# Patient Record
Sex: Female | Born: 1995 | Race: Black or African American | Hispanic: No | Marital: Single | Smoking: Former smoker
Health system: Southern US, Community
[De-identification: ages and names within clinical notes are randomized; demographics above are authoritative.]

## PROBLEM LIST (undated history)

## (undated) DIAGNOSIS — B009 Herpesviral infection, unspecified: Secondary | ICD-10-CM

## (undated) DIAGNOSIS — Z789 Other specified health status: Secondary | ICD-10-CM

## (undated) DIAGNOSIS — F419 Anxiety disorder, unspecified: Secondary | ICD-10-CM

## (undated) HISTORY — PX: OTHER SURGICAL HISTORY: SHX169

---
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
## 2014-01-02 LAB — OB RESULTS CONSOLE GC/CHLAMYDIA
Chlamydia: POSITIVE
Gonorrhea: NEGATIVE

## 2014-01-02 LAB — OB RESULTS CONSOLE RPR: RPR: NONREACTIVE

## 2014-02-09 NOTE — L&D Delivery Note (Signed)
Patient is 19 y.o. 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  APGAR: 8, 9; weight pending Placenta status: , .  Cord: 3 vessels with the following complications:   Anesthesia:  epidural Episiotomy:  none Lacerations:  None, bilateral superior labial abrasions Suture Repair: n/a Est. Blood Loss (mL):  150mL  Mom to postpartum.  Baby to Couplet care / Skin to Skin.  Natasha Padilla 04/16/2014, 10:26 AM

## 2014-02-23 LAB — OB RESULTS CONSOLE PLATELET COUNT: PLATELETS: 203 10*3/uL

## 2014-02-23 LAB — OB RESULTS CONSOLE HGB/HCT, BLOOD
HCT: 33 %
HEMOGLOBIN: 11 g/dL

## 2014-02-23 LAB — HEMOGLOBIN A1C: Hgb A1C (fingerstick): 5.2

## 2014-03-12 ENCOUNTER — Other Ambulatory Visit (HOSPITAL_COMMUNITY): Payer: Self-pay | Admitting: Specialist

## 2014-03-12 DIAGNOSIS — O4102X1 Oligohydramnios, second trimester, fetus 1: Secondary | ICD-10-CM

## 2014-03-13 ENCOUNTER — Other Ambulatory Visit (HOSPITAL_COMMUNITY): Payer: Self-pay | Admitting: Maternal and Fetal Medicine

## 2014-03-13 ENCOUNTER — Encounter (HOSPITAL_COMMUNITY): Payer: Self-pay

## 2014-03-13 ENCOUNTER — Other Ambulatory Visit (HOSPITAL_COMMUNITY): Payer: Self-pay | Admitting: Specialist

## 2014-03-13 ENCOUNTER — Ambulatory Visit (HOSPITAL_COMMUNITY)
Admission: RE | Admit: 2014-03-13 | Discharge: 2014-03-13 | Disposition: A | Discharge status: Home / Self Care | Payer: Medicaid Other | Source: Ambulatory Visit | Attending: Specialist | Admitting: Specialist

## 2014-03-13 DIAGNOSIS — O358XX0 Maternal care for other (suspected) fetal abnormality and damage, not applicable or unspecified: Secondary | ICD-10-CM | POA: Insufficient documentation

## 2014-03-13 DIAGNOSIS — O365930 Maternal care for other known or suspected poor fetal growth, third trimester, not applicable or unspecified: Secondary | ICD-10-CM | POA: Diagnosis not present

## 2014-03-13 DIAGNOSIS — Z3A35 35 weeks gestation of pregnancy: Secondary | ICD-10-CM | POA: Insufficient documentation

## 2014-03-13 DIAGNOSIS — O4102X1 Oligohydramnios, second trimester, fetus 1: Secondary | ICD-10-CM

## 2014-03-13 DIAGNOSIS — O4103X0 Oligohydramnios, third trimester, not applicable or unspecified: Secondary | ICD-10-CM | POA: Insufficient documentation

## 2014-03-13 DIAGNOSIS — Z3689 Encounter for other specified antenatal screening: Secondary | ICD-10-CM | POA: Insufficient documentation

## 2014-03-13 DIAGNOSIS — Z36 Encounter for antenatal screening of mother: Secondary | ICD-10-CM | POA: Insufficient documentation

## 2014-03-13 DIAGNOSIS — O35DXX0 Maternal care for other (suspected) fetal abnormality and damage, fetal gastrointestinal anomalies, not applicable or unspecified: Secondary | ICD-10-CM | POA: Insufficient documentation

## 2014-03-19 LAB — OB RESULTS CONSOLE GBS: GBS: NEGATIVE

## 2014-03-20 ENCOUNTER — Ambulatory Visit (HOSPITAL_COMMUNITY)
Admission: RE | Admit: 2014-03-20 | Discharge: 2014-03-20 | Disposition: A | Discharge status: Home / Self Care | Payer: Medicaid Other | Source: Ambulatory Visit | Attending: Specialist | Admitting: Specialist

## 2014-03-20 ENCOUNTER — Other Ambulatory Visit (HOSPITAL_COMMUNITY): Payer: Self-pay | Admitting: Maternal and Fetal Medicine

## 2014-03-20 DIAGNOSIS — O4102X1 Oligohydramnios, second trimester, fetus 1: Secondary | ICD-10-CM

## 2014-03-20 DIAGNOSIS — Z3A36 36 weeks gestation of pregnancy: Secondary | ICD-10-CM | POA: Insufficient documentation

## 2014-03-20 DIAGNOSIS — IMO0002 Reserved for concepts with insufficient information to code with codable children: Secondary | ICD-10-CM | POA: Insufficient documentation

## 2014-03-20 DIAGNOSIS — O359XX0 Maternal care for (suspected) fetal abnormality and damage, unspecified, not applicable or unspecified: Secondary | ICD-10-CM | POA: Diagnosis present

## 2014-03-20 DIAGNOSIS — O4100X0 Oligohydramnios, unspecified trimester, not applicable or unspecified: Secondary | ICD-10-CM | POA: Insufficient documentation

## 2014-03-21 ENCOUNTER — Encounter: Payer: Self-pay | Admitting: *Deleted

## 2014-03-28 ENCOUNTER — Encounter (HOSPITAL_COMMUNITY): Payer: Self-pay

## 2014-03-28 ENCOUNTER — Ambulatory Visit (HOSPITAL_COMMUNITY)
Admission: RE | Admit: 2014-03-28 | Discharge: 2014-03-28 | Disposition: A | Discharge status: Home / Self Care | Payer: Medicaid Other | Source: Ambulatory Visit | Attending: Specialist | Admitting: Specialist

## 2014-03-28 ENCOUNTER — Other Ambulatory Visit (HOSPITAL_COMMUNITY): Payer: Self-pay | Admitting: Maternal and Fetal Medicine

## 2014-03-28 DIAGNOSIS — O4102X1 Oligohydramnios, second trimester, fetus 1: Secondary | ICD-10-CM

## 2014-03-28 DIAGNOSIS — O358XX0 Maternal care for other (suspected) fetal abnormality and damage, not applicable or unspecified: Secondary | ICD-10-CM | POA: Insufficient documentation

## 2014-03-28 DIAGNOSIS — Z3A37 37 weeks gestation of pregnancy: Secondary | ICD-10-CM | POA: Insufficient documentation

## 2014-03-28 DIAGNOSIS — O35EXX0 Maternal care for other (suspected) fetal abnormality and damage, fetal genitourinary anomalies, not applicable or unspecified: Secondary | ICD-10-CM | POA: Insufficient documentation

## 2014-03-28 HISTORY — DX: Herpesviral infection, unspecified: B00.9

## 2014-03-29 ENCOUNTER — Encounter: Payer: Self-pay | Admitting: Physician Assistant

## 2014-03-29 ENCOUNTER — Ambulatory Visit (INDEPENDENT_AMBULATORY_CARE_PROVIDER_SITE_OTHER): Payer: Medicaid Other | Admitting: Physician Assistant

## 2014-03-29 ENCOUNTER — Other Ambulatory Visit: Payer: Self-pay | Admitting: Physician Assistant

## 2014-03-29 VITALS — BP 123/73 | HR 95 | Temp 98.7°F | Wt 150.2 lb

## 2014-03-29 DIAGNOSIS — Z3403 Encounter for supervision of normal first pregnancy, third trimester: Secondary | ICD-10-CM

## 2014-03-29 DIAGNOSIS — Z3493 Encounter for supervision of normal pregnancy, unspecified, third trimester: Secondary | ICD-10-CM

## 2014-03-29 LAB — POCT URINALYSIS DIP (DEVICE)
BILIRUBIN URINE: NEGATIVE
GLUCOSE, UA: NEGATIVE mg/dL
HGB URINE DIPSTICK: NEGATIVE
Ketones, ur: NEGATIVE mg/dL
LEUKOCYTES UA: NEGATIVE
NITRITE: NEGATIVE
Protein, ur: NEGATIVE mg/dL
Specific Gravity, Urine: 1.02 (ref 1.005–1.030)
UROBILINOGEN UA: 1 mg/dL (ref 0.0–1.0)
pH: 7 (ref 5.0–8.0)

## 2014-03-29 LAB — OB RESULTS CONSOLE GC/CHLAMYDIA: CHLAMYDIA, DNA PROBE: NEGATIVE

## 2014-03-29 MED ORDER — VALACYCLOVIR HCL 1 G PO TABS: 1000.0000 mg | ORAL_TABLET | Freq: Two times a day (BID) | ORAL | Status: DC

## 2014-03-29 NOTE — Progress Notes (Signed)
  Subjective:    [place] Renee Padilla is a G1P0 7871w2d being seen today for her first obstetrical visit.  Her obstetrical history is significant for oligohydramnios/severe pyelectasis.    Patient does not intend to breast feed. Pregnancy history fully reviewed.  Patient reports contractions since 34 weeks and heartburn.  Filed Vitals:   03/29/14 1259  BP: 123/73  Pulse: 95  Temp: 98.7 F (37.1 C)  Weight: 150 lb 3.2 oz (68.13 kg)    HISTORY: OB History  Gravida Para Term Preterm AB SAB TAB Ectopic Multiple Living  1             # Outcome Date GA Lbr Len/2nd Weight Sex Delivery Anes PTL Lv  1 Current              Past Medical History  Diagnosis Date  . Herpes    Past Surgical History  Procedure Laterality Date  . Wisdom teeth removal  2010   Family History  Problem Relation Age of Onset  . Diabetes Father   . Hypertension Maternal Grandmother   . Diabetes Maternal Grandfather   . Diabetes Paternal Grandfather      Exam    Uterus:     Pelvic Exam:    Perineum: No Hemorrhoids, Normal Perineum   Vulva: normal   Vagina:  normal mucosa, normal discharge   pH:    Cervix: no cervical motion tenderness   Adnexa: normal adnexa and no mass, fullness, tenderness   Bony Pelvis: average  System: Breast:  Inspection negative   Skin: normal coloration and turgor, no rashes    Neurologic: oriented, negative   Extremities: normal strength, tone, and muscle mass   HEENT extra ocular movement intact   Mouth/Teeth mucous membranes moist, pharynx normal without lesions   Neck supple   Cardiovascular: regular rate and rhythm   Respiratory:  appears well, vitals normal, no respiratory distress, acyanotic, normal RR, ear and throat exam is normal, neck free of mass or lymphadenopathy, chest clear, no wheezing, crepitations, rhonchi, normal symmetric air entry   Abdomen: soft, non-tender; bowel sounds normal; no masses,  no organomegaly   Urinary: urethral meatus normal       Assessment:    Pregnancy: G1P0 Patient Active Problem List   Diagnosis Date Noted  . Encounter for supervision of normal first pregnancy in third trimester 03/29/2014  . Fetal hydronephrosis in pregnancy, antepartum condition   . [redacted] weeks gestation of pregnancy   . Low amniotic fluid   . Renal anomaly of fetus   . [redacted] weeks gestation of pregnancy   . [redacted] weeks gestation of pregnancy   . Pregnancy complicated by fetal GI abnormality   . Encounter for fetal anatomic survey         Plan:     Initial labs drawn. Prenatal vitamins. Problem list reviewed and updated. Genetic Screening discussed Quad Screen: too late.  Ultrasound discussed; fetal survey: ordered.  Follow up in 1 weeks. 90% of 45 min visit spent on counseling and coordination of care.  MFM has routine scans scheduled 2/23 appt with peds urology  Teague Edwena BlowClark, Allannah Kempen E 03/29/2014

## 2014-03-29 NOTE — Progress Notes (Signed)
Initial OB appointment, transfer from Dr. Shawnie Ponsorn, pt states she has been recently diagnosed with Herpes but has not been prescribed any medication.

## 2014-03-29 NOTE — Patient Instructions (Signed)
Third Trimester of Pregnancy The third trimester is from week 29 through week 42, months 7 through 9. The third trimester is a time when the fetus is growing rapidly. At the end of the ninth month, the fetus is about 20 inches in length and weighs 6-10 pounds.  BODY CHANGES Your body goes through many changes during pregnancy. The changes vary from woman to woman.   Your weight will continue to increase. You can expect to gain 25-35 pounds (11-16 kg) by the end of the pregnancy.  You may begin to get stretch marks on your hips, abdomen, and breasts.  You may urinate more often because the fetus is moving lower into your pelvis and pressing on your bladder.  You may develop or continue to have heartburn as a result of your pregnancy.  You may develop constipation because certain hormones are causing the muscles that push waste through your intestines to slow down.  You may develop hemorrhoids or swollen, bulging veins (varicose veins).  You may have pelvic pain because of the weight gain and pregnancy hormones relaxing your joints between the bones in your pelvis. Backaches may result from overexertion of the muscles supporting your posture.  You may have changes in your hair. These can include thickening of your hair, rapid growth, and changes in texture. Some women also have hair loss during or after pregnancy, or hair that feels dry or thin. Your hair will most likely return to normal after your baby is born.  Your breasts will continue to grow and be tender. A yellow discharge may leak from your breasts called colostrum.  Your belly button may stick out.  You may feel short of breath because of your expanding uterus.  You may notice the fetus "dropping," or moving lower in your abdomen.  You may have a bloody mucus discharge. This usually occurs a few days to a week before labor begins.  Your cervix becomes thin and soft (effaced) near your due date. WHAT TO EXPECT AT YOUR PRENATAL  EXAMS  You will have prenatal exams every 2 weeks until week 36. Then, you will have weekly prenatal exams. During a routine prenatal visit:  You will be weighed to make sure you and the fetus are growing normally.  Your blood pressure is taken.  Your abdomen will be measured to track your baby's growth.  The fetal heartbeat will be listened to.  Any test results from the previous visit will be discussed.  You may have a cervical check near your due date to see if you have effaced. At around 36 weeks, your caregiver will check your cervix. At the same time, your caregiver will also perform a test on the secretions of the vaginal tissue. This test is to determine if a type of bacteria, Group B streptococcus, is present. Your caregiver will explain this further. Your caregiver may ask you:  What your birth plan is.  How you are feeling.  If you are feeling the baby move.  If you have had any abnormal symptoms, such as leaking fluid, bleeding, severe headaches, or abdominal cramping.  If you have any questions. Other tests or screenings that may be performed during your third trimester include:  Blood tests that check for low iron levels (anemia).  Fetal testing to check the health, activity level, and growth of the fetus. Testing is done if you have certain medical conditions or if there are problems during the pregnancy. FALSE LABOR You may feel small, irregular contractions that   eventually go away. These are called Braxton Hicks contractions, or false labor. Contractions may last for hours, days, or even weeks before true labor sets in. If contractions come at regular intervals, intensify, or become painful, it is best to be seen by your caregiver.  SIGNS OF LABOR   Menstrual-like cramps.  Contractions that are 5 minutes apart or less.  Contractions that start on the top of the uterus and spread down to the lower abdomen and back.  A sense of increased pelvic pressure or back  pain.  A watery or bloody mucus discharge that comes from the vagina. If you have any of these signs before the 37th week of pregnancy, call your caregiver right away. You need to go to the hospital to get checked immediately. HOME CARE INSTRUCTIONS   Avoid all smoking, herbs, alcohol, and unprescribed drugs. These chemicals affect the formation and growth of the baby.  Follow your caregiver's instructions regarding medicine use. There are medicines that are either safe or unsafe to take during pregnancy.  Exercise only as directed by your caregiver. Experiencing uterine cramps is a good sign to stop exercising.  Continue to eat regular, healthy meals.  Wear a good support bra for breast tenderness.  Do not use hot tubs, steam rooms, or saunas.  Wear your seat belt at all times when driving.  Avoid raw meat, uncooked cheese, cat litter boxes, and soil used by cats. These carry germs that can cause birth defects in the baby.  Take your prenatal vitamins.  Try taking a stool softener (if your caregiver approves) if you develop constipation. Eat more high-fiber foods, such as fresh vegetables or fruit and whole grains. Drink plenty of fluids to keep your urine clear or pale yellow.  Take warm sitz baths to soothe any pain or discomfort caused by hemorrhoids. Use hemorrhoid cream if your caregiver approves.  If you develop varicose veins, wear support hose. Elevate your feet for 15 minutes, 3-4 times a day. Limit salt in your diet.  Avoid heavy lifting, wear low heal shoes, and practice good posture.  Rest a lot with your legs elevated if you have leg cramps or low back pain.  Visit your dentist if you have not gone during your pregnancy. Use a soft toothbrush to brush your teeth and be gentle when you floss.  A sexual relationship may be continued unless your caregiver directs you otherwise.  Do not travel far distances unless it is absolutely necessary and only with the approval  of your caregiver.  Take prenatal classes to understand, practice, and ask questions about the labor and delivery.  Make a trial run to the hospital.  Pack your hospital bag.  Prepare the baby's nursery.  Continue to go to all your prenatal visits as directed by your caregiver. SEEK MEDICAL CARE IF:  You are unsure if you are in labor or if your water has broken.  You have dizziness.  You have mild pelvic cramps, pelvic pressure, or nagging pain in your abdominal area.  You have persistent nausea, vomiting, or diarrhea.  You have a bad smelling vaginal discharge.  You have pain with urination. SEEK IMMEDIATE MEDICAL CARE IF:   You have a fever.  You are leaking fluid from your vagina.  You have spotting or bleeding from your vagina.  You have severe abdominal cramping or pain.  You have rapid weight loss or gain.  You have shortness of breath with chest pain.  You notice sudden or extreme swelling   of your face, hands, ankles, feet, or legs.  You have not felt your baby move in over an hour.  You have severe headaches that do not go away with medicine.  You have vision changes. Document Released: 01/20/2001 Document Revised: 01/31/2013 Document Reviewed: 03/29/2012 ExitCare Patient Information 2015 [employer]. This information is not intended to replace advice given to you by your health care provider. Make sure you discuss any questions you have with your health care provider.  

## 2014-03-30 ENCOUNTER — Other Ambulatory Visit (HOSPITAL_COMMUNITY): Payer: Self-pay | Admitting: Maternal and Fetal Medicine

## 2014-03-30 DIAGNOSIS — O4102X1 Oligohydramnios, second trimester, fetus 1: Secondary | ICD-10-CM

## 2014-03-30 LAB — GC/CHLAMYDIA PROBE AMP
CT Probe RNA: NEGATIVE
GC Probe RNA: NEGATIVE

## 2014-03-31 LAB — CULTURE, BETA STREP (GROUP B ONLY)

## 2014-04-03 ENCOUNTER — Encounter: Payer: Self-pay | Admitting: Obstetrics & Gynecology

## 2014-04-03 LAB — US OB LIMITED

## 2014-04-06 ENCOUNTER — Encounter (HOSPITAL_COMMUNITY): Payer: Self-pay

## 2014-04-06 ENCOUNTER — Ambulatory Visit (HOSPITAL_COMMUNITY)
Admission: RE | Admit: 2014-04-06 | Discharge: 2014-04-06 | Disposition: A | Discharge status: Home / Self Care | Payer: Medicaid Other | Source: Ambulatory Visit | Attending: Obstetrics & Gynecology | Admitting: Obstetrics & Gynecology

## 2014-04-06 DIAGNOSIS — Z3A38 38 weeks gestation of pregnancy: Secondary | ICD-10-CM | POA: Diagnosis not present

## 2014-04-06 DIAGNOSIS — O358XX0 Maternal care for other (suspected) fetal abnormality and damage, not applicable or unspecified: Secondary | ICD-10-CM | POA: Diagnosis not present

## 2014-04-06 DIAGNOSIS — O4103X0 Oligohydramnios, third trimester, not applicable or unspecified: Secondary | ICD-10-CM | POA: Insufficient documentation

## 2014-04-06 DIAGNOSIS — O35EXX0 Maternal care for other (suspected) fetal abnormality and damage, fetal genitourinary anomalies, not applicable or unspecified: Secondary | ICD-10-CM | POA: Insufficient documentation

## 2014-04-06 DIAGNOSIS — O4102X1 Oligohydramnios, second trimester, fetus 1: Secondary | ICD-10-CM

## 2014-04-12 ENCOUNTER — Encounter (HOSPITAL_COMMUNITY): Payer: Self-pay | Admitting: *Deleted

## 2014-04-12 ENCOUNTER — Ambulatory Visit (HOSPITAL_COMMUNITY)
Admission: RE | Admit: 2014-04-12 | Discharge: 2014-04-12 | Disposition: A | Discharge status: Home / Self Care | Payer: Medicaid Other | Source: Ambulatory Visit | Attending: Obstetrics & Gynecology | Admitting: Obstetrics & Gynecology

## 2014-04-12 ENCOUNTER — Inpatient Hospital Stay (HOSPITAL_COMMUNITY)
Admission: AD | Admit: 2014-04-12 | Discharge: 2014-04-12 | Disposition: A | Discharge status: Home / Self Care | Payer: Medicaid Other | Source: Ambulatory Visit | Attending: Obstetrics & Gynecology | Admitting: Obstetrics & Gynecology

## 2014-04-12 ENCOUNTER — Encounter (HOSPITAL_COMMUNITY): Payer: Self-pay

## 2014-04-12 ENCOUNTER — Ambulatory Visit (INDEPENDENT_AMBULATORY_CARE_PROVIDER_SITE_OTHER): Payer: Medicaid Other | Admitting: Obstetrics & Gynecology

## 2014-04-12 VITALS — BP 132/83 | HR 79 | Wt 151.9 lb

## 2014-04-12 DIAGNOSIS — O4103X0 Oligohydramnios, third trimester, not applicable or unspecified: Secondary | ICD-10-CM | POA: Diagnosis present

## 2014-04-12 DIAGNOSIS — O358XX1 Maternal care for other (suspected) fetal abnormality and damage, fetus 1: Secondary | ICD-10-CM

## 2014-04-12 DIAGNOSIS — O4103X1 Oligohydramnios, third trimester, fetus 1: Secondary | ICD-10-CM

## 2014-04-12 DIAGNOSIS — O35DXX1 Maternal care for other (suspected) fetal abnormality and damage, fetal gastrointestinal anomalies, fetus 1: Secondary | ICD-10-CM

## 2014-04-12 DIAGNOSIS — Z3493 Encounter for supervision of normal pregnancy, unspecified, third trimester: Secondary | ICD-10-CM

## 2014-04-12 DIAGNOSIS — O358XX0 Maternal care for other (suspected) fetal abnormality and damage, not applicable or unspecified: Secondary | ICD-10-CM | POA: Insufficient documentation

## 2014-04-12 DIAGNOSIS — Z3A39 39 weeks gestation of pregnancy: Secondary | ICD-10-CM | POA: Insufficient documentation

## 2014-04-12 DIAGNOSIS — K219 Gastro-esophageal reflux disease without esophagitis: Secondary | ICD-10-CM

## 2014-04-12 DIAGNOSIS — O99613 Diseases of the digestive system complicating pregnancy, third trimester: Secondary | ICD-10-CM

## 2014-04-12 DIAGNOSIS — Z8619 Personal history of other infectious and parasitic diseases: Secondary | ICD-10-CM | POA: Insufficient documentation

## 2014-04-12 DIAGNOSIS — O4102X1 Oligohydramnios, second trimester, fetus 1: Secondary | ICD-10-CM

## 2014-04-12 HISTORY — DX: Other specified health status: Z78.9

## 2014-04-12 HISTORY — DX: Herpesviral infection, unspecified: B00.9

## 2014-04-12 LAB — WET PREP, GENITAL
CLUE CELLS WET PREP: NONE SEEN
Trich, Wet Prep: NONE SEEN
Yeast Wet Prep HPF POC: NONE SEEN

## 2014-04-12 LAB — POCT URINALYSIS DIP (DEVICE)
BILIRUBIN URINE: NEGATIVE
GLUCOSE, UA: NEGATIVE mg/dL
Hgb urine dipstick: NEGATIVE
KETONES UR: NEGATIVE mg/dL
Nitrite: NEGATIVE
Protein, ur: NEGATIVE mg/dL
SPECIFIC GRAVITY, URINE: 1.02 (ref 1.005–1.030)
Urobilinogen, UA: 1 mg/dL (ref 0.0–1.0)
pH: 7 (ref 5.0–8.0)

## 2014-04-12 MED ORDER — FLUCONAZOLE 150 MG PO TABS: 150.0000 mg | ORAL_TABLET | Freq: Once | ORAL | Status: DC

## 2014-04-12 MED ORDER — PANTOPRAZOLE SODIUM 40 MG PO TBEC: 40.0000 mg | DELAYED_RELEASE_TABLET | Freq: Two times a day (BID) | ORAL | Status: DC

## 2014-04-12 NOTE — MAU Note (Signed)
Pt sent from MFM. Pt thinks she is leaking fluid.

## 2014-04-12 NOTE — Progress Notes (Signed)
Reflux and nausea and vomiting, Zantac not helping, change to Protonix. Vaginal itch and discharge, exam no hsv lesions, wet prep done, c/w yeast. Diflucan prescribed HSV precautions given.

## 2014-04-12 NOTE — Patient Instructions (Signed)
Genital Herpes °Genital herpes is a sexually transmitted disease. This means that it is a disease passed by having sex with an infected person. There is no cure for genital herpes. The time between attacks can be months to years. The virus may live in a person but produce no problems (symptoms). This infection can be passed to a baby as it travels down the birth canal (vagina). In a newborn, this can cause central nervous system damage, eye damage, or even death. The virus that causes genital herpes is usually HSV-2 virus. The virus that causes oral herpes is usually HSV-1. The diagnosis (learning what is wrong) is made through culture results. °SYMPTOMS  °Usually symptoms of pain and itching begin a few days to a week after contact. It first appears as small blisters that progress to small painful ulcers which then scab over and heal after several days. It affects the outer genitalia, birth canal, cervix, penis, anal area, buttocks, and thighs. °HOME CARE INSTRUCTIONS  °· Keep ulcerated areas dry and clean. °· Take medications as directed. Antiviral medications can speed up healing. They will not prevent recurrences or cure this infection. These medications can also be taken for suppression if there are frequent recurrences. °· While the infection is active, it is contagious. Avoid all sexual contact during active infections. °· Condoms may help prevent spread of the herpes virus. °· Practice safe sex. °· Wash your hands thoroughly after touching the genital area. °· Avoid touching your eyes after touching your genital area. °· Inform your caregiver if you have had genital herpes and become pregnant. It is your responsibility to insure a safe outcome for your baby in this pregnancy. °· Only take over-the-counter or prescription medicines for pain, discomfort, or fever as directed by your caregiver. °SEEK MEDICAL CARE IF:  °· You have a recurrence of this infection. °· You do not respond to medications and are not  improving. °· You have new sources of pain or discharge which have changed from the original infection. °· You have an oral temperature above 102° F (38.9° C). °· You develop abdominal pain. °· You develop eye pain or signs of eye infection. °Document Released: 01/24/2000 Document Revised: 04/20/2011 Document Reviewed: 02/13/2009 °ExitCare® Patient Information ©2015 [employer]. This information is not intended to replace advice given to you by your health care provider. Make sure you discuss any questions you have with your health care provider. ° °

## 2014-04-12 NOTE — MAU Provider Note (Signed)
History     CSN: [id number]  Arrival date and time: 04/12/14 1158   First Provider Initiated Contact with Patient 04/12/14 1242      Chief Complaint  Patient presents with  . Rupture of Membranes   HPI  Natasha Padilla, 19 y.o. G1P0 at 1645w2d arrived from MFM today after concern of fluid leakage and low-normal AFI 7.04 cm by u/s today.  Of note, at office visit today she was diagnosed with candidiasis by wet prep and started on diflucan.  Complains of vaginal discharge, thick white/yellow, vaginal pruiritis, no abnormal smell.   Additional OB hx of HSV on valtrex.   Pregnancy notable for fetus w/R hydronephrosis.  MFM note from day of presentation, states plan is for scheduled induction at 40w if she does not deliver before then with plan to avoid post-dates delivery.       [facility]  Dating U/S in first trim  Genetic Screen 1 Screen: AFP: Quad: NIPS:  Anatomic US   GTT Early: Third trimester:   TDaP vaccine Jan 2016  Flu vaccine Jan 2016  GBS   Contraception Nexplanon  Baby Food Breast  Circumcision unsure  Pediatrician [facility]  Support Person Mom, grandmother, best friend        Past Medical History  Diagnosis Date  . Herpes   . Medical history non-contributory   . HSV infection     Past Surgical History  Procedure Laterality Date  . Wisdom teeth removal  2010    Family History  Problem Relation Age of Onset  . Diabetes Father   . Hypertension Maternal Grandmother   . Diabetes Maternal Grandfather   . Diabetes Paternal Grandfather     History  Substance Use Topics  . Smoking status: Former Games developermoker  . Smokeless tobacco: Not on file  . Alcohol Use: No    Allergies: No Known Allergies  Prescriptions prior to admission  Medication Sig Dispense Refill Last Dose  . fluconazole (DIFLUCAN) 150 MG tablet Take 1 tablet (150 mg total) by mouth once. 1 tablet 0  Taking  . pantoprazole (PROTONIX) 40 MG tablet Take 1 tablet (40 mg total) by mouth 2 (two) times daily. 40 tablet 1 Taking  . Prenatal Vit-Fe Fumarate-FA (PRENATAL VITAMIN PO) Take by mouth.   Taking  . valACYclovir (VALTREX) 1000 MG tablet Take 1 tablet (1,000 mg total) by mouth 2 (two) times daily. 60 tablet 1 Taking    Review of Systems  Constitutional: Negative for fever, chills and malaise/fatigue.  Eyes: Negative for blurred vision and double vision.  Respiratory: Negative for cough and shortness of breath.   Cardiovascular: Negative for chest pain and palpitations.  Gastrointestinal: Positive for nausea. Negative for heartburn, vomiting, abdominal pain, diarrhea and constipation.  Genitourinary: Negative for dysuria and hematuria.  Musculoskeletal: Negative for myalgias.  Skin: Negative for rash.  Neurological: Negative for dizziness, loss of consciousness and headaches.   Physical Exam   Blood pressure 134/91, pulse 106.  BP 121/78 mmHg  Pulse 92  Temp(Src) 97.4 F (36.3 C) (Oral)  Resp 18   BP in MAU: 134/91, 134/91, and 121/78   Physical Exam  Constitutional: She is oriented to person, place, and time. She appears well-developed and well-nourished.  HENT:  Head: Normocephalic and atraumatic.  Eyes: Conjunctivae and EOM are normal. Pupils are equal, round, and reactive to light.  Neck: Normal range of motion.  Cardiovascular: Normal rate, regular rhythm, normal heart sounds and intact distal pulses.   Respiratory: Effort normal  and breath sounds normal.  GI: Soft. Bowel sounds are normal.  Genitourinary: Vagina normal.  Musculoskeletal: Normal range of motion.  Neurological: She is alert and oriented to person, place, and time.  Skin: Skin is warm and dry.  Psychiatric: She has a normal mood and affect. Her behavior is normal.  Sterile speculum exam w/copious thick white discharge, no abnormal smell; no pooling of clear fluid   MAU Course   Procedures  Reviewed u/s 04/12/2014: 7.04 cm AFI (Low normal) confirmed today by u/s, reviewed by Damaris Hippo MD.   Assessment and Plan  A: 19 y.o. G1P0 at [redacted]w[redacted]d w/ concern for amniotic fluid leakage, low-normal AFI by u/s     Diagnosed with yeast infection at office visit today; started on diflucan  P: Neg ferning.  Sterile speculum exam w/copious thick white discharge c/w candidiasis      One abnormal BP today of 134/91; remainder of BPs wnl; asymptomatic     FHT reassuring     Patient anxious to leave; removed FHT herself; almost left AMA     Discharged home with strict return precautions; low threshold to consider induction given known fetal abnormalities and desire for delivery NLT 40w.  Padilla,Natasha 04/12/2014, 12:53 PM   CNM attestation:  I have seen and examined this patient; I agree with above documentation in the resident's note.   Natasha Padilla is a 19 y.o. G1P0 sent from MFM for evaluation of leakage of fluid.  Pt reports she has discharge, and does not believe her water is broken.  She is upset because her time in MFM and then MAU has been too long and she wants to go home.  She denies h/a, epigastric pain, or visual disturbances.  She reports good fetal movement, denies regular contractions, LOF, vaginal bleeding, vaginal itching/burning, urinary symptoms, dizziness, n/v, or fever/chills.     PE: BP 121/78 mmHg  Pulse 92  Temp(Src) 97.4 F (36.3 C) (Oral)  Resp 18 Gen: calm comfortable, NAD Resp: normal effort, no distress Abd: gravid  ROS, labs, PMH reviewed NST reactive   Management in MAU: SSE, ferning, NST  When CNM evaluated blood pressures in MFM and in MAU, and some found to be elevated, labs were ordered.  Pt wanted to leave AMA at this time and did not want to stay for labs.  CNM gave preeclampsia precautions to pt with plan to return to clinic tomorrow for BP check.  Pt unsure about ride to hospital tomorrow but will try to come in.    Plan: D/C home with preeclampsia precautions Fetal kick counts reinforced, labor precautions given Continue Diflucan as prescribed F/U in clinic tomorrow morning Return to MAU as needed for emergencies  LEFTWICH-KIRBY, LISA, CNM 3:27 PM

## 2014-04-12 NOTE — MAU Note (Signed)
Pt states she has been leaking fluid since yesterday.  Small amounts at a time like when coughing, sneezing, laughing.  Pt states it's yellowish in color.  Denies vaginal bleeding.  Good fetal movement.

## 2014-04-12 NOTE — Progress Notes (Signed)
Edema- feet   Pain/pressure- pelvic

## 2014-04-13 ENCOUNTER — Encounter (HOSPITAL_COMMUNITY): Payer: Self-pay | Admitting: *Deleted

## 2014-04-13 ENCOUNTER — Telehealth: Payer: Self-pay | Admitting: Advanced Practice Midwife

## 2014-04-13 ENCOUNTER — Inpatient Hospital Stay (HOSPITAL_COMMUNITY)
Admission: AD | Admit: 2014-04-13 | Discharge: 2014-04-13 | Disposition: A | Discharge status: Home / Self Care | Payer: Medicaid Other | Source: Ambulatory Visit | Attending: Obstetrics & Gynecology | Admitting: Obstetrics & Gynecology

## 2014-04-13 DIAGNOSIS — O163 Unspecified maternal hypertension, third trimester: Secondary | ICD-10-CM

## 2014-04-13 DIAGNOSIS — O9989 Other specified diseases and conditions complicating pregnancy, childbirth and the puerperium: Secondary | ICD-10-CM | POA: Insufficient documentation

## 2014-04-13 DIAGNOSIS — Z87891 Personal history of nicotine dependence: Secondary | ICD-10-CM | POA: Diagnosis not present

## 2014-04-13 DIAGNOSIS — Z3A39 39 weeks gestation of pregnancy: Secondary | ICD-10-CM

## 2014-04-13 DIAGNOSIS — R030 Elevated blood-pressure reading, without diagnosis of hypertension: Secondary | ICD-10-CM | POA: Insufficient documentation

## 2014-04-13 DIAGNOSIS — O133 Gestational [pregnancy-induced] hypertension without significant proteinuria, third trimester: Secondary | ICD-10-CM

## 2014-04-13 LAB — COMPREHENSIVE METABOLIC PANEL
ALK PHOS: 298 U/L — AB (ref 39–117)
ALT: 13 U/L (ref 0–35)
AST: 22 U/L (ref 0–37)
Albumin: 3.1 g/dL — ABNORMAL LOW (ref 3.5–5.2)
Anion gap: 8 (ref 5–15)
BUN: 6 mg/dL (ref 6–23)
CHLORIDE: 107 mmol/L (ref 96–112)
CO2: 22 mmol/L (ref 19–32)
CREATININE: 0.54 mg/dL (ref 0.50–1.10)
Calcium: 9.2 mg/dL (ref 8.4–10.5)
GFR calc Af Amer: >90 mL/min (ref >90)
GFR calc non Af Amer: >90 mL/min (ref >90)
GLUCOSE: 108 mg/dL — AB (ref 70–99)
Potassium: 3.7 mmol/L (ref 3.5–5.1)
Sodium: 137 mmol/L (ref 135–145)
Total Bilirubin: 0.7 mg/dL (ref 0.3–1.2)
Total Protein: 6.1 g/dL (ref 6.0–8.3)

## 2014-04-13 LAB — URINALYSIS, ROUTINE W REFLEX MICROSCOPIC
Bilirubin Urine: NEGATIVE
GLUCOSE, UA: NEGATIVE mg/dL
Hgb urine dipstick: NEGATIVE
KETONES UR: NEGATIVE mg/dL
LEUKOCYTES UA: NEGATIVE
Nitrite: NEGATIVE
Protein, ur: NEGATIVE mg/dL
Specific Gravity, Urine: 1.02 (ref 1.005–1.030)
Urobilinogen, UA: 1 mg/dL (ref 0.0–1.0)
pH: 7 (ref 5.0–8.0)

## 2014-04-13 LAB — PROTEIN / CREATININE RATIO, URINE
CREATININE, URINE: 213 mg/dL
Protein Creatinine Ratio: 0.06 (ref 0.00–0.15)
Total Protein, Urine: 12 mg/dL

## 2014-04-13 LAB — CBC
HEMATOCRIT: 32.4 % — AB (ref 36.0–46.0)
Hemoglobin: 10.7 g/dL — ABNORMAL LOW (ref 12.0–15.0)
MCH: 27.8 pg (ref 26.0–34.0)
MCHC: 33 g/dL (ref 30.0–36.0)
MCV: 84.2 fL (ref 78.0–100.0)
Platelets: 194 10*3/uL (ref 150–400)
RBC: 3.85 MIL/uL — ABNORMAL LOW (ref 3.87–5.11)
RDW: 13.6 % (ref 11.5–15.5)
WBC: 8.5 10*3/uL (ref 4.0–10.5)

## 2014-04-13 LAB — URIC ACID: Uric Acid, Serum: 4.4 mg/dL (ref 2.4–7.0)

## 2014-04-13 NOTE — MAU Provider Note (Signed)
  History     CSN: [id number]  Arrival date and time: 04/13/14 2030   None     Chief Complaint  Patient presents with  . Hypertension   HPI Mrs. Natasha Padilla is a 19yo G1P0 at 39 weeks 3 days presenting today for blood work for elevated blood pressure yesterday. Denies loss of fluid. Denies vaginal bleeding or discharge. Denies headache, epigastric pain, blurred vision.   Recently seen in MAU on 04/11/13 and [facility], however she was unable to come because of transportation. Left AMA yesterday before labs could be done.  OB History    Gravida Para Term Preterm AB TAB SAB Ectopic Multiple Living   1         0    .  Past Medical History  Diagnosis Date  . Herpes   . Medical history non-contributory   . HSV infection     Past Surgical History  Procedure Laterality Date  . Wisdom teeth removal  2010    Family History  Problem Relation Age of Onset  . Diabetes Father   . Hypertension Maternal Grandmother   . Diabetes Maternal Grandfather   . Diabetes Paternal Grandfather     History  Substance Use Topics  . Smoking status: Former Games developermoker  . Smokeless tobacco: Not on file  . Alcohol Use: No    Allergies: No Known Allergies  Prescriptions prior to admission  Medication Sig Dispense Refill Last Dose  . Prenatal Vit-Fe Fumarate-FA (PRENATAL VITAMIN PO) Take 1 tablet by mouth daily.    04/12/2014 at Unknown time  . valACYclovir (VALTREX) 1000 MG tablet Take 1 tablet (1,000 mg total) by mouth 2 (two) times daily. 60 tablet 1 04/12/2014 at Unknown time  . fluconazole (DIFLUCAN) 150 MG tablet Take 1 tablet (150 mg total) by mouth once. (Patient not taking: Reported on 04/13/2014) 1 tablet 0 Taking  . pantoprazole (PROTONIX) 40 MG tablet Take 1 tablet (40 mg total) by mouth 2 (two) times daily. (Patient not taking: Reported on 04/13/2014) 40 tablet 1 Taking    ROS Physical Exam   Blood pressure 127/77, pulse 97, temperature 98.1 F (36.7 C), resp. rate 18,  height 5\' 5"  (1.651 m), weight 70.217 kg (154 lb 12.8 oz).  Physical Exam  Constitutional: She appears well-developed and well-nourished. No distress.  Cardiovascular: Normal rate.  Exam reveals no gallop and no friction rub.   Respiratory: Effort normal. No respiratory distress. She has no wheezes. She has no rales.  GI: Soft.  Musculoskeletal: She exhibits no edema.    MAU Course  Procedures  MDM CBC: Hemoglobin 10.7 Urinalysis: no protein noted CMP: AST and ALT normal Protein-Creatinine Ratio: 0.06 Fetal Monitor: 140bpm, moderate variability, + accelerations, no decelerations  Assessment and Plan  Stable for discharge. Return precautions given. Handout on elevated BP in pregnancy given.  [name], [place] N 04/13/2014, 10:04 PM   I have seen and examined this patient and I agree with the above. Cam HaiSHAW, Quavon Keisling CNM 11:44 PM 04/13/2014

## 2014-04-13 NOTE — Telephone Encounter (Signed)
Pt returned call and will come to MAU for BP evaluation and NST tonight.

## 2014-04-13 NOTE — MAU Note (Signed)
States Misty StanleyLisa told her to come in today for blood work and to monitor the baby. B/P was high yesterday. Denies LOF or bleeding.

## 2014-04-13 NOTE — Discharge Instructions (Signed)

## 2014-04-13 NOTE — Telephone Encounter (Signed)
Called pt to request she return to MAU today for BP check, NST, and preeclampsia labs. Pt was discharged yesterday [facility] but was unable to come because of transportation.  Pt denies h/a, epigastric pain, or visual disturbances today during phone conversation.  Pt has to find out if she can get a ride to MAU today/tonight and will call provider back.

## 2014-04-15 ENCOUNTER — Encounter (HOSPITAL_COMMUNITY): Payer: Self-pay | Admitting: *Deleted

## 2014-04-15 ENCOUNTER — Telehealth: Payer: Self-pay | Admitting: Advanced Practice Midwife

## 2014-04-15 ENCOUNTER — Inpatient Hospital Stay (HOSPITAL_COMMUNITY)
Admission: AD | Admit: 2014-04-15 | Discharge: 2014-04-18 | DRG: 774 | Disposition: A | Discharge status: Home / Self Care | Payer: Medicaid Other | Source: Ambulatory Visit | Attending: Obstetrics & Gynecology | Admitting: Obstetrics & Gynecology

## 2014-04-15 DIAGNOSIS — A6000 Herpesviral infection of urogenital system, unspecified: Secondary | ICD-10-CM | POA: Diagnosis present

## 2014-04-15 DIAGNOSIS — O358XX0 Maternal care for other (suspected) fetal abnormality and damage, not applicable or unspecified: Secondary | ICD-10-CM | POA: Diagnosis present

## 2014-04-15 DIAGNOSIS — O9832 Other infections with a predominantly sexual mode of transmission complicating childbirth: Secondary | ICD-10-CM | POA: Diagnosis present

## 2014-04-15 DIAGNOSIS — O4103X1 Oligohydramnios, third trimester, fetus 1: Secondary | ICD-10-CM

## 2014-04-15 DIAGNOSIS — F53 Puerperal psychosis: Secondary | ICD-10-CM | POA: Diagnosis not present

## 2014-04-15 DIAGNOSIS — Z3A39 39 weeks gestation of pregnancy: Secondary | ICD-10-CM | POA: Diagnosis present

## 2014-04-15 DIAGNOSIS — F4323 Adjustment disorder with mixed anxiety and depressed mood: Secondary | ICD-10-CM | POA: Diagnosis not present

## 2014-04-15 DIAGNOSIS — Z87891 Personal history of nicotine dependence: Secondary | ICD-10-CM

## 2014-04-15 DIAGNOSIS — N858 Other specified noninflammatory disorders of uterus: Secondary | ICD-10-CM | POA: Diagnosis present

## 2014-04-15 DIAGNOSIS — Z308 Encounter for other contraceptive management: Secondary | ICD-10-CM

## 2014-04-15 LAB — CBC
HCT: 32.4 % — ABNORMAL LOW (ref 36.0–46.0)
Hemoglobin: 10.7 g/dL — ABNORMAL LOW (ref 12.0–15.0)
MCH: 27.6 pg (ref 26.0–34.0)
MCHC: 33 g/dL (ref 30.0–36.0)
MCV: 83.5 fL (ref 78.0–100.0)
PLATELETS: 198 10*3/uL (ref 150–400)
RBC: 3.88 MIL/uL (ref 3.87–5.11)
RDW: 13.4 % (ref 11.5–15.5)
WBC: 9.8 10*3/uL (ref 4.0–10.5)

## 2014-04-15 LAB — POCT FERN TEST: POCT FERN TEST: POSITIVE

## 2014-04-15 LAB — OB RESULTS CONSOLE HIV ANTIBODY (ROUTINE TESTING): HIV: NONREACTIVE

## 2014-04-15 MED ORDER — VALACYCLOVIR HCL 500 MG PO TABS
1000.0000 mg | ORAL_TABLET | Freq: Two times a day (BID) | ORAL | Status: DC
  Filled 2014-04-15 (×2): qty 2

## 2014-04-15 MED ORDER — OXYCODONE-ACETAMINOPHEN 5-325 MG PO TABS: 2.0000 | ORAL_TABLET | ORAL | Status: DC | PRN

## 2014-04-15 MED ORDER — ACETAMINOPHEN 325 MG PO TABS: 650.0000 mg | ORAL_TABLET | ORAL | Status: DC | PRN

## 2014-04-15 MED ORDER — LACTATED RINGERS IV SOLN: 500.0000 mL | INTRAVENOUS | Status: DC | PRN

## 2014-04-15 MED ORDER — LIDOCAINE HCL (PF) 1 % IJ SOLN
30.0000 mL | INTRAMUSCULAR | Status: DC | PRN
  Filled 2014-04-15: qty 30

## 2014-04-15 MED ORDER — CITRIC ACID-SODIUM CITRATE 334-500 MG/5ML PO SOLN: 30.0000 mL | ORAL | Status: DC | PRN

## 2014-04-15 MED ORDER — LACTATED RINGERS IV SOLN
INTRAVENOUS | Status: DC
  Administered 2014-04-15 – 2014-04-16 (×3): via INTRAVENOUS

## 2014-04-15 MED ORDER — OXYTOCIN BOLUS FROM INFUSION: 500.0000 mL | INTRAVENOUS | Status: DC

## 2014-04-15 MED ORDER — PANTOPRAZOLE SODIUM 40 MG PO TBEC: 40.0000 mg | DELAYED_RELEASE_TABLET | Freq: Two times a day (BID) | ORAL | Status: DC

## 2014-04-15 MED ORDER — ONDANSETRON HCL 4 MG/2ML IJ SOLN: 4.0000 mg | Freq: Four times a day (QID) | INTRAMUSCULAR | Status: DC | PRN

## 2014-04-15 MED ORDER — OXYTOCIN 40 UNITS IN LACTATED RINGERS INFUSION - SIMPLE MED
62.5000 mL/h | INTRAVENOUS | Status: DC
  Administered 2014-04-16: 62.5 mL/h via INTRAVENOUS

## 2014-04-15 MED ORDER — OXYCODONE-ACETAMINOPHEN 5-325 MG PO TABS: 1.0000 | ORAL_TABLET | ORAL | Status: DC | PRN

## 2014-04-15 NOTE — Telephone Encounter (Signed)
Patient called to find out when her induction of labor will be scheduled. She is also requesting that induction of labor be moved sooner because she is very uncomfortable with hot flashes and pelvic pain. Patient denies significant contractions, leaking fluid, vaginal bleeding. Reports normal fetal movement. States maternal fetal medicine doctors recommended induction at 40 weeks because her fluid was low and the baby has kidney problems. Upon review of MFM note MFM did recommend induction at 40 weeks. Induction scheduled for 7:30 PM on March 8. Patient informed that there is not an indication for earlier induction at this time. Comfort measures him a fetal kick counts and labor precautions reviewed.

## 2014-04-15 NOTE — MAU Note (Signed)
Pt states leaking fluid since 8 pm tonight. Hasn't felt baby move since 45 minutes ago.

## 2014-04-15 NOTE — H&P (Signed)
[place] Natasha Padilla is a 19 y.o. female presenting for SROM. Maternal Medical History:  Reason for admission: Rupture of membranes.  SROM at 2000 cl fluid noted with gross rupture noted on admit.  Contractions: Onset was 1-2 hours ago.   Frequency: irregular.   Perceived severity is mild.    Fetal activity: Perceived fetal activity is normal.   Last perceived fetal movement was within the past hour.    Prenatal complications: Fetus has hx of renal anomalies. And has been evaluated at [facility] and given to ok to deliver here and baby is to be transferred if any complications.    OB History    Gravida Para Term Preterm AB TAB SAB Ectopic Multiple Living   1         0     Past Medical History  Diagnosis Date  . Herpes   . Medical history non-contributory   . HSV infection    Past Surgical History  Procedure Laterality Date  . Wisdom teeth removal  2010   Family History: family history includes Diabetes in her father, maternal grandfather, and paternal grandfather; Hypertension in her maternal grandmother. Social History:  reports that she has quit smoking. She does not have any smokeless tobacco history on file. She reports that she uses illicit drugs (Marijuana). She reports that she does not drink alcohol.   Prenatal Transfer Tool  Maternal Diabetes: No Genetic Screening: Normal Maternal Ultrasounds/Referrals: Abnormal:  Findings:   Fetal Kidney Anomalies Fetal Ultrasounds or other Referrals:  Fetal echo, Referred to Materal Fetal Medicine  Maternal Substance Abuse:  No Significant Maternal Medications:  None Significant Maternal Lab Results:  None Other Comments:  None  Review of Systems  Constitutional: Negative.   HENT: Negative.   Eyes: Negative.   Respiratory: Negative.   Cardiovascular: Negative.   Gastrointestinal: Positive for abdominal pain.  Genitourinary: Negative.   Musculoskeletal: Negative.   Skin: Negative.   Neurological: Negative.    Endo/Heme/Allergies: Negative.   Psychiatric/Behavioral: Negative.     Dilation: 1 Effacement (%): 60 Station: -3 Exam by:: Claud Kelprue Harris RN  Blood pressure 122/79, pulse 103, temperature 98.4 F (36.9 C), temperature source Oral, height 5\' 6"  (1.676 m), weight 153 lb 9.6 oz (69.673 kg). Maternal Exam:  Uterine Assessment: Contraction strength is mild.  Contraction frequency is irregular.   Abdomen: Patient reports no abdominal tenderness. Fetal presentation: vertex  Introitus: Normal vulva.   Fetal Exam Fetal Monitor Review: Mode: ultrasound.   Variability: moderate (6-25 bpm).   Pattern: accelerations present.    Fetal State Assessment: Category I - tracings are normal.     Physical Exam  Constitutional: She is oriented to person, place, and time. She appears well-developed and well-nourished.  HENT:  Head: Normocephalic.  Eyes: Pupils are equal, round, and reactive to light.  Neck: Normal range of motion.  Cardiovascular: Normal rate, regular rhythm, normal heart sounds and intact distal pulses.   Respiratory: Effort normal and breath sounds normal.  GI: Soft. Bowel sounds are normal.  Genitourinary: Vagina normal.  Neurological: She is alert and oriented to person, place, and time. She has normal reflexes.  Skin: Skin is warm and dry.  Psychiatric: She has a normal mood and affect. Her behavior is normal. Judgment and thought content normal.    Prenatal labs: ABO, Rh: O/Positive/-- (09/16 0000) Antibody: Negative (09/16 0000) Rubella: Immune (09/16 0000) RPR: Nonreactive (11/24 0000)  HBsAg: Negative (09/16 0000)  HIV:    GBS: Negative (02/08 0000)  Assessment/Plan: SROM at 2000 tonight. admit   Natasha Padilla 04/15/2014, 11:00 PM

## 2014-04-16 ENCOUNTER — Encounter (HOSPITAL_COMMUNITY): Payer: Self-pay | Admitting: *Deleted

## 2014-04-16 ENCOUNTER — Inpatient Hospital Stay (HOSPITAL_COMMUNITY): Payer: Medicaid Other | Admitting: Anesthesiology

## 2014-04-16 DIAGNOSIS — Z3A39 39 weeks gestation of pregnancy: Secondary | ICD-10-CM

## 2014-04-16 LAB — RAPID HIV SCREEN (WH-MAU): SUDS RAPID HIV SCREEN: NONREACTIVE

## 2014-04-16 LAB — RPR: RPR: NONREACTIVE

## 2014-04-16 LAB — TYPE AND SCREEN
ABO/RH(D): O POS
ANTIBODY SCREEN: NEGATIVE

## 2014-04-16 LAB — ABO/RH: ABO/RH(D): O POS

## 2014-04-16 MED ORDER — EPHEDRINE 5 MG/ML INJ
10.0000 mg | INTRAVENOUS | Status: DC | PRN
  Filled 2014-04-16: qty 2

## 2014-04-16 MED ORDER — WITCH HAZEL-GLYCERIN EX PADS: 1.0000 "application " | MEDICATED_PAD | CUTANEOUS | Status: DC | PRN

## 2014-04-16 MED ORDER — SODIUM CHLORIDE 0.9 % IV SOLN: 250.0000 mL | INTRAVENOUS | Status: DC | PRN

## 2014-04-16 MED ORDER — BENZOCAINE-MENTHOL 20-0.5 % EX AERO
1.0000 "application " | INHALATION_SPRAY | CUTANEOUS | Status: DC | PRN
  Administered 2014-04-16: 1 via TOPICAL
  Filled 2014-04-16: qty 56

## 2014-04-16 MED ORDER — PHENYLEPHRINE 40 MCG/ML (10ML) SYRINGE FOR IV PUSH (FOR BLOOD PRESSURE SUPPORT)
80.0000 ug | PREFILLED_SYRINGE | INTRAVENOUS | Status: DC | PRN
  Filled 2014-04-16: qty 20
  Filled 2014-04-16: qty 2

## 2014-04-16 MED ORDER — DIPHENHYDRAMINE HCL 25 MG PO CAPS: 25.0000 mg | ORAL_CAPSULE | Freq: Four times a day (QID) | ORAL | Status: DC | PRN

## 2014-04-16 MED ORDER — ZOLPIDEM TARTRATE 5 MG PO TABS
5.0000 mg | ORAL_TABLET | Freq: Every evening | ORAL | Status: DC | PRN
Start: 2014-04-16 — End: 2014-04-18

## 2014-04-16 MED ORDER — OXYTOCIN 40 UNITS IN LACTATED RINGERS INFUSION - SIMPLE MED: 62.5000 mL/h | INTRAVENOUS | Status: DC | PRN

## 2014-04-16 MED ORDER — FLEET ENEMA 7-19 GM/118ML RE ENEM: 1.0000 | ENEMA | Freq: Every day | RECTAL | Status: DC | PRN

## 2014-04-16 MED ORDER — FENTANYL 2.5 MCG/ML BUPIVACAINE 1/10 % EPIDURAL INFUSION (WH - ANES)
14.0000 mL/h | INTRAMUSCULAR | Status: DC | PRN
  Filled 2014-04-16: qty 125

## 2014-04-16 MED ORDER — OXYTOCIN 40 UNITS IN LACTATED RINGERS INFUSION - SIMPLE MED
1.0000 m[IU]/min | INTRAVENOUS | Status: DC
  Administered 2014-04-16: 2 m[IU]/min via INTRAVENOUS
  Filled 2014-04-16: qty 1000

## 2014-04-16 MED ORDER — ONDANSETRON HCL 4 MG/2ML IJ SOLN: 4.0000 mg | INTRAMUSCULAR | Status: DC | PRN

## 2014-04-16 MED ORDER — DIPHENHYDRAMINE HCL 50 MG/ML IJ SOLN: 12.5000 mg | INTRAMUSCULAR | Status: DC | PRN

## 2014-04-16 MED ORDER — TERBUTALINE SULFATE 1 MG/ML IJ SOLN
0.2500 mg | Freq: Once | INTRAMUSCULAR | Status: DC | PRN
  Filled 2014-04-16: qty 1

## 2014-04-16 MED ORDER — FENTANYL 2.5 MCG/ML BUPIVACAINE 1/10 % EPIDURAL INFUSION (WH - ANES)
INTRAMUSCULAR | Status: DC | PRN
  Administered 2014-04-16: 14 mL/h via EPIDURAL

## 2014-04-16 MED ORDER — SIMETHICONE 80 MG PO CHEW: 80.0000 mg | CHEWABLE_TABLET | ORAL | Status: DC | PRN

## 2014-04-16 MED ORDER — NALBUPHINE HCL 10 MG/ML IJ SOLN
10.0000 mg | INTRAMUSCULAR | Status: DC | PRN
  Administered 2014-04-16: 10 mg via INTRAVENOUS
  Filled 2014-04-16: qty 1

## 2014-04-16 MED ORDER — DIBUCAINE 1 % RE OINT: 1.0000 "application " | TOPICAL_OINTMENT | RECTAL | Status: DC | PRN

## 2014-04-16 MED ORDER — SODIUM CHLORIDE 0.9 % IJ SOLN: 3.0000 mL | Freq: Two times a day (BID) | INTRAMUSCULAR | Status: DC

## 2014-04-16 MED ORDER — FENTANYL CITRATE 0.05 MG/ML IJ SOLN
100.0000 ug | INTRAMUSCULAR | Status: DC | PRN
  Administered 2014-04-16: 100 ug via INTRAVENOUS
  Filled 2014-04-16: qty 2

## 2014-04-16 MED ORDER — PRENATAL MULTIVITAMIN CH
1.0000 | ORAL_TABLET | Freq: Every day | ORAL | Status: DC
  Administered 2014-04-17: 1 via ORAL
  Filled 2014-04-16: qty 1

## 2014-04-16 MED ORDER — SODIUM CHLORIDE 0.9 % IJ SOLN: 3.0000 mL | INTRAMUSCULAR | Status: DC | PRN

## 2014-04-16 MED ORDER — BISACODYL 10 MG RE SUPP
10.0000 mg | Freq: Every day | RECTAL | Status: DC | PRN
Start: 2014-04-16 — End: 2014-04-18

## 2014-04-16 MED ORDER — LACTATED RINGERS IV SOLN: 500.0000 mL | Freq: Once | INTRAVENOUS | Status: DC

## 2014-04-16 MED ORDER — LIDOCAINE HCL (PF) 1 % IJ SOLN
INTRAMUSCULAR | Status: DC | PRN
  Administered 2014-04-16 (×2): 4 mL

## 2014-04-16 MED ORDER — SENNOSIDES-DOCUSATE SODIUM 8.6-50 MG PO TABS
2.0000 | ORAL_TABLET | ORAL | Status: DC
  Filled 2014-04-16 (×2): qty 2

## 2014-04-16 MED ORDER — ONDANSETRON HCL 4 MG PO TABS: 4.0000 mg | ORAL_TABLET | ORAL | Status: DC | PRN

## 2014-04-16 MED ORDER — IBUPROFEN 600 MG PO TABS
600.0000 mg | ORAL_TABLET | Freq: Four times a day (QID) | ORAL | Status: DC
  Administered 2014-04-16 – 2014-04-18 (×4): 600 mg via ORAL
  Filled 2014-04-16 (×7): qty 1

## 2014-04-16 MED ORDER — LANOLIN HYDROUS EX OINT: TOPICAL_OINTMENT | CUTANEOUS | Status: DC | PRN

## 2014-04-16 MED ORDER — PHENYLEPHRINE 40 MCG/ML (10ML) SYRINGE FOR IV PUSH (FOR BLOOD PRESSURE SUPPORT)
80.0000 ug | PREFILLED_SYRINGE | INTRAVENOUS | Status: DC | PRN
  Filled 2014-04-16: qty 2

## 2014-04-16 NOTE — Progress Notes (Signed)
[place] Natasha MealyRenee Padilla is a 19 y.o. G1P0 at 6220w6d   Subjective: Notes contractions every 2 minutes. Pain significant, but better with epidural.  Objective: BP 146/95 mmHg  Pulse 91  Temp(Src) 98.4 F (36.9 C) (Oral)  Resp 20  Ht 5\' 6"  (1.676 m)  Wt 69.4 kg (153 lb)  BMI 24.71 kg/m2      FHT:  FHR: 130 bpm, variability: moderate,  accelerations:  Present,  decelerations:  Absent UC:   regular SVE:   Dilation: 2 Effacement (%): 90 Station: -2 Exam by:: Natasha GelinasG. Whitfield, RN  Labs: Lab Results  Component Value Date   WBC 9.8 04/15/2014   HGB 10.7* 04/15/2014   HCT 32.4* 04/15/2014   MCV 83.5 04/15/2014   PLT 198 04/15/2014    Assessment / Plan: Spontaneous labor, progressing normally  Labor: Progressing on Pitocin Preeclampsia:  no signs or symptoms of toxicity Fetal Wellbeing:  Category I Pain Control:  Epidural I/D:  Padilla/a Anticipated MOD:  NSVD  Natasha Padilla, Natasha Padilla 04/16/2014, 7:28 AM

## 2014-04-16 NOTE — Anesthesia Preprocedure Evaluation (Signed)
Anesthesia Evaluation  Patient identified by MRN, date of birth, ID band Patient awake    Reviewed: Allergy & Precautions, NPO status , Patient's Chart, lab work & pertinent test results  History of Anesthesia Complications Negative for: history of anesthetic complications  Airway Mallampati: II  TM Distance: >3 FB Neck ROM: Full    Dental no notable dental hx. (+) Dental Advisory Given   Pulmonary former smoker,  breath sounds clear to auscultation  Pulmonary exam normal       Cardiovascular negative cardio ROS  Rhythm:Regular Rate:Normal     Neuro/Psych negative neurological ROS  negative psych ROS   GI/Hepatic Neg liver ROS, GERD-  Medicated and Controlled,  Endo/Other  negative endocrine ROS  Renal/GU negative Renal ROS  negative genitourinary   Musculoskeletal negative musculoskeletal ROS (+)   Abdominal   Peds negative pediatric ROS (+)  Hematology negative hematology ROS (+)   Anesthesia Other Findings   Reproductive/Obstetrics (+) Pregnancy                             Anesthesia Physical Anesthesia Plan  ASA: II  Anesthesia Plan: Epidural   Post-op Pain Management:    Induction:   Airway Management Planned:   Additional Equipment:   Intra-op Plan:   Post-operative Plan:   Informed Consent: I have reviewed the patients History and Physical, chart, labs and discussed the procedure including the risks, benefits and alternatives for the proposed anesthesia with the patient or authorized representative who has indicated his/her understanding and acceptance.   Dental advisory given  Plan Discussed with:   Anesthesia Plan Comments:         Anesthesia Quick Evaluation

## 2014-04-16 NOTE — Lactation Note (Signed)
This note was copied from the chart of Boy [place] Demetriou. Lactation Consultation Note  Patient Name: Boy [place] Sebastiani WUJWJ'XToday's Date: 04/16/2014 Reason for consult: Initial assessment;NICU baby;Other (Comment) (baby with hydrnephrosis) Mom is a 19 yo primipara and is in NICU attempting to latch her newborn.  Baby is sleepy but arousable and able to sustain a latch to mom's (R) breast in cross-cradle position for 10 minutes,  Initially, he has some strong sucking bursts and before latch, drops of colostrum was easily expressible.   LC instructed mom in hand expression, breast support and breast compression, signs of proper latch and ways to stimulate baby to suck. He is too sleepy to maintain rhythmical sucking but no tremors were noted.  Report given to NICU RN concerning feeding.  Mom to initiate use of DEBP when she returns to her room.  LC discussed pumping frequency (q3h for 15 minutes) and encouraged mom to combine pumping with hand expression and breast massage to increase stimulation of her milk supply and flow.  LC also reviewed NICU pumping booklet and storage guidelines, as well as the normal transition from little flow to more flow over next few days.  LC encouraged STS and cue feeding once baby stabilized and not in NICU. Mom's pp RN, Tresa EndoKelly will start pump when mom returns to pp.Mom encouraged to feed baby 8-12 times/24 hours and with feeding cues. LC encouraged review of Baby and Me pp 9, 14 and 20-25 for STS and BF information. LC provided [employer] Resource brochure and reviewed [facility] services and list of community and web site resources.      Maternal Data Formula Feeding for Exclusion: Yes Reason for exclusion: Admission to Intensive Care Unit (ICU) post-partum Has patient been taught Hand Expression?: Yes (RN at 1215 and LC at 1650) Does the patient have breastfeeding experience prior to this delivery?: No  Feeding Feeding Type: Breast Fed Length of feed: 10 min  LATCH  Score/Interventions Latch: Repeated attempts needed to sustain latch, nipple held in mouth throughout feeding, stimulation needed to elicit sucking reflex. Intervention(s): Assist with latch;Breast compression  Audible Swallowing: A few with stimulation Intervention(s): Skin to skin;Hand expression Intervention(s): Hand expression;Skin to skin;Alternate breast massage  Type of Nipple: Everted at rest and after stimulation  Comfort (Breast/Nipple): Soft / non-tender     Hold (Positioning): Assistance needed to correctly position infant at breast and maintain latch. Intervention(s): Breastfeeding basics reviewed;Support Pillows;Position options;Skin to skin  LATCH Score: 7 (LC assisted and observed)  Lactation Tools Discussed/Used Pump Review: Milk Storage (RN to set-up and assist with pumping when mom returns to her room) Date initiated:: 04/16/14 DEBP, hand expression, STS, cue feedings (once baby no longer in NICU)  Consult Status Consult Status: Follow-up Date: 04/17/14 Follow-up type: In-patient    Warrick ParisianBryant, Misk [place] [facility] 04/16/2014, 5:25 PM

## 2014-04-16 NOTE — Anesthesia Procedure Notes (Signed)
Epidural Patient location during procedure: OB Start time: 04/16/2014 6:45 AM  Staffing Anesthesiologist: Felipe DroneJUDD, Jerek Meulemans JENNETTE Performed by: anesthesiologist   Preanesthetic Checklist Completed: patient identified, site marked, surgical consent, pre-op evaluation, timeout performed, IV checked, risks and benefits discussed and monitors and equipment checked  Epidural Patient position: sitting Prep: site prepped and draped and DuraPrep Patient monitoring: continuous pulse ox and blood pressure Approach: midline Location: L3-L4 Injection technique: LOR saline  Needle:  Needle type: Tuohy  Needle gauge: 17 G Needle length: 9 cm and 9 Needle insertion depth: 5 cm cm Catheter type: closed end flexible Catheter size: 19 Gauge Catheter at skin depth: 10 cm Test dose: negative  Assessment Events: blood not aspirated, injection not painful, no injection resistance, negative IV test and no paresthesia  Additional Notes Patient identified. Risks/Benefits/Options discussed with patient including but not limited to bleeding, infection, nerve damage, paralysis, failed block, incomplete pain control, headache, blood pressure changes, nausea, vomiting, reactions to medication both or allergic, itching and postpartum back pain. Confirmed with bedside nurse the patient's most recent platelet count. Confirmed with patient that they are not currently taking any anticoagulation, have any bleeding history or any family history of bleeding disorders. Patient expressed understanding and wished to proceed. All questions were answered. Sterile technique was used throughout the entire procedure. Please see nursing notes for vital signs. Test dose was given through epidural catheter and negative prior to continuing to dose epidural or start infusion. Warning signs of high block given to the patient including shortness of breath, tingling/numbness in hands, complete motor block, or any concerning symptoms with  instructions to call for help. Patient was given instructions on fall risk and not to get out of bed. All questions and concerns addressed with instructions to call with any issues or inadequate analgesia.

## 2014-04-17 ENCOUNTER — Inpatient Hospital Stay (HOSPITAL_COMMUNITY): Admission: RE | Admit: 2014-04-17 | Payer: Medicaid Other | Source: Ambulatory Visit

## 2014-04-17 DIAGNOSIS — F53 Puerperal psychosis: Secondary | ICD-10-CM

## 2014-04-17 DIAGNOSIS — F531 Puerperal psychosis: Secondary | ICD-10-CM

## 2014-04-17 DIAGNOSIS — O99345 Other mental disorders complicating the puerperium: Secondary | ICD-10-CM | POA: Insufficient documentation

## 2014-04-17 DIAGNOSIS — F4323 Adjustment disorder with mixed anxiety and depressed mood: Secondary | ICD-10-CM

## 2014-04-17 MED ORDER — LIDOCAINE HCL 1 % IJ SOLN
0.0000 mL | Freq: Once | INTRAMUSCULAR | Status: AC | PRN
  Filled 2014-04-17: qty 20

## 2014-04-17 MED ORDER — ETONOGESTREL 68 MG ~~LOC~~ IMPL
68.0000 mg | DRUG_IMPLANT | Freq: Once | SUBCUTANEOUS | Status: AC
  Administered 2014-04-18: 68 mg via SUBCUTANEOUS
  Filled 2014-04-17: qty 1

## 2014-04-17 NOTE — Progress Notes (Signed)
Patient requested that NICU MD come and speak to her about baby.  Patient states "I can watch my son for pee diapers and care for him better than a nurse with 3 other babies, I want him brought downstairs."  Called NICU to request MD come speak with patient.

## 2014-04-17 NOTE — Progress Notes (Signed)
During shift patient refused her medication.  Patient stated "the medicine that you say you been giving me is not what you say it is." The patient believes that we are giving her medication to make her drowsy.  Patient stated that she is also upset because she feels as though the NICU nurses can and should bring the baby to her. Mom stated that she can provide the same care as nurses and she feels as though her son is healthy and he should be with her on the mother baby unit.

## 2014-04-17 NOTE — Progress Notes (Signed)
Patient heard arguing on the phone with someone by Shea EvansJamie Fristoe NT. Asher MuirJamie then asked patient if there was anything she needed after filling the water and leaving clean linens.  The patient said she could use "an alcoholic beverage and a 22 to blow my head off". Patient said she was just upset with the father of the baby because he said he is not coming here. Since unable to detect if patient was serious about suicide thought, Dr Adrian BlackwaterStinson was called to determine if he wanted to initiate suicide precautions.  He gave verbal order for this until patient could be further evaluated.  Social worker Loleta BooksSarah Venning arrived as order was being put in.  Sarah to go in immediately to assess patient for suicidal ideation.

## 2014-04-17 NOTE — Progress Notes (Signed)
UR chart review completed.  

## 2014-04-17 NOTE — Lactation Note (Signed)
This note was copied from the chart of [name]. Lactation Consultation Note  Follow up visit with mom.  She is pumping and putting baby to breast.  She states she is obtaining a small amount from one breast.  [place] [facility]  Instructed mom to call [facility] and I will [facility].  Patient Name: [name] Today's Date: 04/17/2014     Maternal Data    Feeding Feeding Type: Formula Nipple Type: Slow - flow Length of feed: 10 min  LATCH Score/Interventions                      Lactation Tools Discussed/Used     Consult Status      Huston FoleyMOULDEN, Natasha Poyser S 04/17/2014, 11:37 AM

## 2014-04-17 NOTE — Progress Notes (Signed)
Post Partum Day #1 Subjective:  Natasha Padilla is a 19 y.o. G1P1001 2654w6d s/p SVD.  Nursing reports patient has refused to take medications, stating they are lying to her about what medications they are giving and trying to sedate her. Also reports that she has been insisting her baby stay with her instead of NICU because she can provide better care; states she has decided to bottle feed baby because bottles make babies pee more. Received call that she requests a gun to shoot her brains out.  Pt denies problems with ambulating, voiding or po intake.  She denies nausea or vomiting.  Pain is well controlled.  She has had flatus. She has not had bowel movement.  Lochia minimal.  Plan for birth control is Nexplanon.  Method of Feeding: Bottle  Objective: Blood pressure 127/86, pulse 84, temperature 98.3 F (36.8 C), temperature source Oral, resp. rate 18, height 5\' 6"  (1.676 m), weight 69.4 kg (153 lb), SpO2 99 %, unknown if currently breastfeeding.  Physical Exam:  General: alert, cooperative and no distress Lochia:normal flow Chest: CTAB, no wheezes noted Heart: RRR no m/r/g Abdomen: soft, nontender  Uterine Fundus: firm DVT Evaluation: No evidence of DVT seen on physical exam. Extremities: No edema   Recent Labs  04/15/14 2318  HGB 10.7*  HCT 32.4*    Assessment/Plan:  ASSESSMENT: Natasha Padilla is a 19 y.o. G1P1001 5854w6d s/p SVD.  Social Work consult. Requests Nexplanon; will not place at this time due to possible psychosis. Psychology consult in place for possible psychosis and suicidal ideation. Order for sitter and suicide precautions placed.    LOS: 2 days   [name], [place] N 04/17/2014, 8:59 AM

## 2014-04-17 NOTE — Anesthesia Postprocedure Evaluation (Signed)
  Anesthesia Post-op Note  Patient: Natasha Padilla  Procedure(s) Performed: * No procedures listed *  Patient Location: Mother/Baby  Anesthesia Type:Epidural  Level of Consciousness: awake  Airway and Oxygen Therapy: Patient Spontanous Breathing  Post-op Pain: mild  Post-op Assessment: Patient's Cardiovascular Status Stable and Respiratory Function Stable  Post-op Vital Signs: stable  Last Vitals:  Filed Vitals:   04/16/14 2300  BP: 108/62  Pulse: 83  Temp: 36.9 C  Resp: 16    Complications: No apparent anesthesia complications

## 2014-04-17 NOTE — Consult Note (Signed)
[facility] Face-to-Face Psychiatry Consult   Reason for Consult:  Agitation and suicide/homicide ideation  Referring Physician:  Dr. Caroleen Hamman  Patient Identification: Natasha Padilla MRN:  [id number] Principal Diagnosis: Adjustment disorder with mixed anxiety and depressed mood Diagnosis:   Patient Active Problem List   Diagnosis Date Noted  . 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 Active labor at term [O80] 04/15/2014  . GERD (gastroesophageal reflux disease) [K21.9] 04/12/2014  . Hx of herpes genitalis [Z86.19] 04/12/2014  . Pregnancy complicated by fetal GU abnormality [O35.8XX0]   . Encounter for supervision of normal first pregnancy in third trimester [Z34.03] 03/29/2014  . Fetal hydronephrosis in pregnancy, antepartum condition [O35.8XX0]   . Low amniotic fluid [O41.00X0]   . 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 Pregnancy complicated by fetal GI abnormality [O35.8XX0]     Total Time spent with patient: 45 minutes  Subjective:   Natasha Padilla is a 19 y.o. female patient admitted with labor.  HPI:  Natasha Padilla is a 19 y.o. female seen, chart reviewed and case discussed with the unit social worker for psychiatric consultation and evaluation of possible postpartum depression/psychosis and suicidal/homicidal ideation. Patient reported she had an augment with the baby father who blamed her for not letting him know about being in labor. Patient reported she broke up with him few months after being involved with him. Patient was not supportive having pregnancy and not involved throughout the pregnancy care. Patient wanted to give him a chance to be part of child's life but he was not acting right or take responsibility. Patient talked about hurting him when she was angered. Patient was calm, cooperative and pleasant during this evaluation. Patient has a good mood with appropriate and bright affect. Patient has a normal rate rhythm and volume of speech. Patient has a  linear and goal-directed thought process patient denies current suicidal, homicidal ideation, intention or plans. Patient has no evidence of psychosis. Patient has no previous acute psychiatric hospitalizations. Patient was seen family doctor for ADHD and was placed on Vyvanse which causing her anger so she stopped in middle of high school years. Patient was graduated from high school and planning to participate in further education and also part-time work.  History: Patient with G1P1001 [redacted]w[redacted]d s/p SVD. Nursing reports patient has refused to take medications, stating they are lying to her about what medications they are giving and trying to sedate her. Also reports that she has been insisting her baby stay with her instead of NICU because she can provide better care; states she has decided to bottle feed baby because bottles make babies pee more. Received call that she requests a gun to shoot her brains out. Pt denies problems with ambulating, voiding or po intake. She denies nausea or vomiting. Pain is well controlled. She has had flatus. She has not had bowel movement. Lochia minimal. Plan for birth control is Nexplanon. Method of Feeding: Bottle  HPI Elements:   Location:  Postpartum and adjustment issues. Quality:  Good. Severity:  Mild. Timing:  postpartum adjustment issues and relationship problems. Duration:  One-day. Context:  Psychosocial adjustments.  Past Medical History:  Past Medical History  Diagnosis Date  . Herpes   . Medical history non-contributory   . HSV infection     Past Surgical History  Procedure Laterality Date  . Wisdom teeth removal  2010   Family History:  Family History  Problem Relation Age of Onset  . Diabetes Father   . Hypertension Maternal  Grandmother   . Diabetes Maternal Grandfather   . Diabetes Paternal Grandfather    Social History:  History  Alcohol Use No     History  Drug Use  . Yes  . Special: Marijuana    History   Social  History  . Marital Status: Single    Spouse Name: N/A  . Number of Children: N/A  . Years of Education: N/A   Social History Main Topics  . Smoking status: Former Games developer  . Smokeless tobacco: Not on file  . Alcohol Use: No  . Drug Use: Yes    Special: Marijuana  . Sexual Activity: Not Currently   Other Topics Concern  . None   Social History Narrative   Additional Social History:                          Allergies:  No Known Allergies  Vitals: Blood pressure 127/86, pulse 84, temperature 98.3 F (36.8 C), temperature source Oral, resp. rate 18, height  (1.676 m), weight 69.4 kg (153 lb), SpO2 99 %, unknown if currently breastfeeding.  Risk to Self: Is patient at risk for suicide?: No Risk to Others:   Prior Inpatient Therapy:   Prior Outpatient Therapy:    Current Facility-Administered Medications  Medication Dose Route Frequency Provider Last Rate Last Dose  . sodium chloride 0.9 % injection 3 mL  3 mL Intravenous Q12H Fredirick Lathe, MD       And  . sodium chloride 0.9 % injection 3 mL  3 mL Intravenous PRN Fredirick Lathe, MD       And  . 0.9 %  sodium chloride infusion  250 mL Intravenous PRN Fredirick Lathe, MD      . benzocaine-Menthol (DERMOPLAST) 20-0.5 % topical spray 1 application  1 application Topical PRN Fredirick Lathe, MD   1 application at 04/16/14 1300  . bisacodyl (DULCOLAX) suppository 10 mg  10 mg Rectal Daily PRN Fredirick Lathe, MD      . witch hazel-glycerin (TUCKS) pad 1 application  1 application Topical PRN Fredirick Lathe, MD       And  . dibucaine (NUPERCAINAL) 1 % rectal ointment 1 application  1 application Rectal PRN Fredirick Lathe, MD      . diphenhydrAMINE (BENADRYL) capsule 25 mg  25 mg Oral Q6H PRN Fredirick Lathe, MD      . ibuprofen (ADVIL,MOTRIN) tablet 600 mg  600 mg Oral 4 times per day Fredirick Lathe, MD   600 mg at 04/16/14 1834  . lanolin ointment   Topical PRN Fredirick Lathe, MD      . ondansetron [facility]) tablet 4 mg  4 mg  Oral Q4H PRN Fredirick Lathe, MD       Or  . ondansetron [facility]) injection 4 mg  4 mg Intravenous Q4H PRN Fredirick Lathe, MD      . oxytocin (PITOCIN) IV infusion 40 units in LR 1000 mL  62.5 mL/hr Intravenous Continuous PRN Fredirick Lathe, MD      . prenatal multivitamin tablet 1 tablet  1 tablet Oral Q1200 Fredirick Lathe, MD      . senna-docusate (Senokot-S) tablet 2 tablet  2 tablet Oral Q24H Fredirick Lathe, MD   2 tablet at 04/16/14 2351  . simethicone (MYLICON) chewable tablet 80 mg  80 mg Oral PRN Fredirick Lathe, MD      . sodium phosphate (FLEET) 7-19 GM/118ML enema 1 enema  1 enema Rectal Daily PRN Fredirick Lathe,  MD      . zolpidem (AMBIEN) tablet 5 mg  5 mg Oral QHS PRN Fredirick LatheKristy Acosta, MD       Facility-Administered Medications Ordered in Other Encounters  Medication Dose Route Frequency Provider Last Rate Last Dose  . fentaNYL 2.5 mcg/ml w/bupivacaine 0.1% in NS 100ml epidural infusion (WH-ANES)    Continuous PRN Karie SchwalbeMary Judd, MD 14 mL/hr at 04/16/14 0656 14 mL/hr at 04/16/14 0656  . lidocaine (PF) (XYLOCAINE) 1 % injection    Anesthesia Intra-op Karie SchwalbeMary Judd, MD   4 mL at 04/16/14 0655    Musculoskeletal: Strength & Muscle Tone: within normal limits Gait & Station: normal Patient leans: Right  Psychiatric Specialty Exam: Physical Exam as per history and physical   ROS adjustment problem with the anger and relationship problems   Blood pressure 127/86, pulse 84, temperature 98.3 F (36.8 C), temperature source Oral, resp. rate 18, height 5\' 6"  (1.676 m), weight 69.4 kg (153 lb), SpO2 99 %, unknown if currently breastfeeding.Body mass index is 24.71 kg/(m^2).  General Appearance: Casual  Eye Contact::  Good  Speech:  Clear and Coherent  Volume:  Normal  Mood:  Euthymic  Affect:  Appropriate and Congruent  Thought Process:  Coherent and Goal Directed  Orientation:  Full (Time, Place, and Person)  Thought Content:  Negative  Suicidal Thoughts:  No  Homicidal Thoughts:  No  Memory:   Immediate;   Good Recent;   Good  Judgement:  Intact  Insight:  Good  Psychomotor Activity:  Normal  Concentration:  Good  Recall:  Good  Fund of Knowledge:Good  Language: Good  Akathisia:  Negative  Handed:  Right  AIMS (if indicated):     Assets:  Communication Skills Desire for Improvement Financial Resources/Insurance Housing Intimacy Leisure Time Physical Health Resilience Social Support Talents/Skills  ADL's:  Intact  Cognition: WNL  Sleep:      Medical Decision Making: Review of Psycho-Social Stressors (1), New Problem, with no additional work-up planned (3), Review of Last Therapy Session (1) and Review of Medication Regimen & Side Effects (2)  Treatment Plan Summary: Daily contact with patient to assess and evaluate symptoms and progress in treatment and Medication management  Plan:  Patient does not meet criteria for psychiatric inpatient admission. Supportive therapy provided about ongoing stressors.   Disposition:  Patient may be able to go with her mom and she is stable psychiatrically and has not required medication management. Patient will be referred to outpatient counseling services as needed.  Amjad Fikes,JANARDHAHA R. 04/17/2014 9:52 AM
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
## 2014-04-17 NOTE — Progress Notes (Addendum)
Social worker and MD saw patient and decided there was no need for suicide precautions or sitter. Order for these discontinued by Dr Adrian BlackwaterStinson. Plan for psych consult to happen today.

## 2014-04-18 MED ORDER — LIDOCAINE HCL 1 % IJ SOLN
10.0000 mL | Freq: Once | INTRAMUSCULAR | Status: AC
  Administered 2014-04-18: 10 mL via INTRADERMAL
  Filled 2014-04-18: qty 10

## 2014-04-18 MED ORDER — IBUPROFEN 600 MG PO TABS: 600.0000 mg | ORAL_TABLET | Freq: Four times a day (QID) | ORAL | Status: DC

## 2014-04-18 NOTE — Procedures (Signed)
Consent obtained with risks discussed. Sign out prior to procedure. Site of insertion demarcated prior to procedure. Area cleaned with alcohol swabs x2. Lidocaine used for anesthesia. Area cleaned with betadine x2. Sterile field obtained. Nexplanon inserted at site demarcated prior to procedure without complication. Pressure applied to insertion site. Nexplanon palpable through skin. Area bandaged.  Dr. Caroleen Hammanumley, DO Family Medicine, PGY-1
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
## 2014-04-18 NOTE — Lactation Note (Signed)
This note was copied from the chart of Natasha [place]. Lactation Consultation Note; Mother request to see LC. She is having concerns that the Rt breast is not leaking milk. Assist mother with hand expression and observed large drops of colostrum. Mother was given a hand pump with a #27 flange. Observed mother pumping for several mins and obtained a few more drops of colostrum. Mother states that she is breastfeeding infant and supplementing with formula. She plans to breastfeed infant . Reviewed supply and demand as well as proper latch. Reviewed Baby and me book again today. Discussed treatment to prevent engorgement. Advised the use of ice and good breast massage. [facility] [facility] [place] [facility]. She states that she is still active with [name] She denies having a scheduled appt. Advised [place] to [place] Family member states that she will buy mother a pump if needed. Advised mother to feed infant 8-12 times daily. Mother receptive to all teaching.   Patient Name: Natasha Padilla WUJWJ'XToday's Date: 04/18/2014 Reason for consult: Follow-up assessment   Maternal Data    Feeding Feeding Type: Bottle Fed - Formula Length of feed: 20 min (per mom)  LATCH Score/Interventions                      Lactation Tools Discussed/Used     Consult Status Consult Status: Complete    Natasha Padilla, Natasha Padilla 04/18/2014, 10:51 AM
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
## 2014-04-18 NOTE — Progress Notes (Signed)
Nexplanon implant done at bedside with Dr. Shawnie PonsPratt attending. Consent obtained and time out performed and charted prior to procedure. Boykin PeekNancy Aleea Hendry, RN

## 2014-04-18 NOTE — Progress Notes (Signed)
Patient refused 0000 medication as well as assessment on baby.  Mom stated that baby is sleeping and that she does not want baby to be disturbed while sleeping.  I educated the mom on the importance of the PKU screening and as well as the baby assessment.  Patient also refused for me to asess her as while.

## 2014-04-19 ENCOUNTER — Ambulatory Visit: Payer: Self-pay

## 2014-04-19 NOTE — Lactation Note (Signed)
This note was copied from the chart of Natasha [place]. Lactation Consultation Note: Mom reports baby has been fussy the last few hours. Reports baby was latching well but through the night did not want to latch. Has given bottled of formula and EBM. Last had formula about 30 min ago. Offered assist with latch before DC- mom to page if wants assist. Plans to but DEBP for home use. No questions at present. To call prn  Patient Name: Natasha Padilla: 04/19/2014 Reason for consult: Follow-up assessment   Maternal Data    Feeding    LATCH Score/Interventions                      Lactation Tools Discussed/Used     Consult Status Consult Status: Complete    Pamelia HoitWeeks, Resa Rinks D 04/19/2014, 8:42 AM

## 2014-04-20 ENCOUNTER — Ambulatory Visit (HOSPITAL_COMMUNITY): Payer: Medicaid Other

## 2014-04-23 ENCOUNTER — Encounter: Payer: Self-pay | Admitting: Obstetrics & Gynecology

## 2014-05-30 ENCOUNTER — Ambulatory Visit: Payer: Medicaid Other | Admitting: Obstetrics & Gynecology

## 2014-12-13 ENCOUNTER — Encounter: Payer: Self-pay | Admitting: *Deleted

## 2015-01-14 ENCOUNTER — Encounter (HOSPITAL_BASED_OUTPATIENT_CLINIC_OR_DEPARTMENT_OTHER): Payer: Self-pay | Admitting: Emergency Medicine

## 2015-01-14 ENCOUNTER — Emergency Department (HOSPITAL_BASED_OUTPATIENT_CLINIC_OR_DEPARTMENT_OTHER)
Admission: EM | Admit: 2015-01-14 | Discharge: 2015-01-14 | Disposition: A | Discharge status: Home / Self Care | Payer: No Typology Code available for payment source | Attending: Emergency Medicine | Admitting: Emergency Medicine

## 2015-01-14 ENCOUNTER — Emergency Department (HOSPITAL_BASED_OUTPATIENT_CLINIC_OR_DEPARTMENT_OTHER): Payer: No Typology Code available for payment source

## 2015-01-14 DIAGNOSIS — Z87891 Personal history of nicotine dependence: Secondary | ICD-10-CM | POA: Diagnosis not present

## 2015-01-14 DIAGNOSIS — Z79899 Other long term (current) drug therapy: Secondary | ICD-10-CM | POA: Diagnosis not present

## 2015-01-14 DIAGNOSIS — S4992XA Unspecified injury of left shoulder and upper arm, initial encounter: Secondary | ICD-10-CM | POA: Diagnosis present

## 2015-01-14 DIAGNOSIS — S59902A Unspecified injury of left elbow, initial encounter: Secondary | ICD-10-CM | POA: Diagnosis not present

## 2015-01-14 DIAGNOSIS — Y9389 Activity, other specified: Secondary | ICD-10-CM | POA: Insufficient documentation

## 2015-01-14 DIAGNOSIS — Z8619 Personal history of other infectious and parasitic diseases: Secondary | ICD-10-CM | POA: Diagnosis not present

## 2015-01-14 DIAGNOSIS — Y92410 Unspecified street and highway as the place of occurrence of the external cause: Secondary | ICD-10-CM | POA: Insufficient documentation

## 2015-01-14 DIAGNOSIS — S40012A Contusion of left shoulder, initial encounter: Secondary | ICD-10-CM | POA: Diagnosis not present

## 2015-01-14 DIAGNOSIS — Y998 Other external cause status: Secondary | ICD-10-CM | POA: Insufficient documentation

## 2015-01-14 MED ORDER — CYCLOBENZAPRINE HCL 10 MG PO TABS: 10.0000 mg | ORAL_TABLET | Freq: Every day | ORAL | Status: DC

## 2015-01-14 MED ORDER — NAPROXEN 500 MG PO TABS: 500.0000 mg | ORAL_TABLET | Freq: Two times a day (BID) | ORAL | Status: DC

## 2015-01-14 MED ORDER — IBUPROFEN 800 MG PO TABS
800.0000 mg | ORAL_TABLET | Freq: Once | ORAL | Status: AC
  Administered 2015-01-14: 800 mg via ORAL
  Filled 2015-01-14: qty 1
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
## 2015-05-08 ENCOUNTER — Emergency Department (HOSPITAL_BASED_OUTPATIENT_CLINIC_OR_DEPARTMENT_OTHER)
Admission: EM | Admit: 2015-05-08 | Discharge: 2015-05-08 | Disposition: A | Discharge status: Home / Self Care | Payer: Medicaid Other | Attending: Emergency Medicine | Admitting: Emergency Medicine

## 2015-05-08 ENCOUNTER — Encounter (HOSPITAL_BASED_OUTPATIENT_CLINIC_OR_DEPARTMENT_OTHER): Payer: Self-pay

## 2015-05-08 ENCOUNTER — Emergency Department (HOSPITAL_BASED_OUTPATIENT_CLINIC_OR_DEPARTMENT_OTHER): Payer: Medicaid Other

## 2015-05-08 DIAGNOSIS — J159 Unspecified bacterial pneumonia: Secondary | ICD-10-CM | POA: Insufficient documentation

## 2015-05-08 DIAGNOSIS — Z87891 Personal history of nicotine dependence: Secondary | ICD-10-CM | POA: Diagnosis not present

## 2015-05-08 DIAGNOSIS — Z8619 Personal history of other infectious and parasitic diseases: Secondary | ICD-10-CM | POA: Diagnosis not present

## 2015-05-08 DIAGNOSIS — J029 Acute pharyngitis, unspecified: Secondary | ICD-10-CM | POA: Insufficient documentation

## 2015-05-08 DIAGNOSIS — R1110 Vomiting, unspecified: Secondary | ICD-10-CM | POA: Diagnosis not present

## 2015-05-08 DIAGNOSIS — H9202 Otalgia, left ear: Secondary | ICD-10-CM | POA: Diagnosis not present

## 2015-05-08 DIAGNOSIS — R51 Headache: Secondary | ICD-10-CM | POA: Diagnosis not present

## 2015-05-08 DIAGNOSIS — R52 Pain, unspecified: Secondary | ICD-10-CM | POA: Diagnosis present

## 2015-05-08 DIAGNOSIS — R000 Tachycardia, unspecified: Secondary | ICD-10-CM | POA: Insufficient documentation

## 2015-05-08 DIAGNOSIS — J189 Pneumonia, unspecified organism: Secondary | ICD-10-CM

## 2015-05-08 LAB — RAPID STREP SCREEN (MED CTR MEBANE ONLY): STREPTOCOCCUS, GROUP A SCREEN (DIRECT): NEGATIVE

## 2015-05-08 MED ORDER — AZITHROMYCIN 250 MG PO TABS: ORAL_TABLET | ORAL | Status: DC

## 2015-05-08 MED ORDER — AZITHROMYCIN 250 MG PO TABS
500.0000 mg | ORAL_TABLET | Freq: Once | ORAL | Status: AC
  Administered 2015-05-08: 500 mg via ORAL
  Filled 2015-05-08: qty 2

## 2015-05-08 MED ORDER — AZITHROMYCIN 250 MG PO TABS: 250.0000 mg | ORAL_TABLET | Freq: Every day | ORAL | Status: DC

## 2015-05-08 MED ORDER — ACETAMINOPHEN 325 MG PO TABS
650.0000 mg | ORAL_TABLET | Freq: Once | ORAL | Status: AC
  Administered 2015-05-08: 650 mg via ORAL
  Filled 2015-05-08: qty 2

## 2015-05-08 NOTE — ED Provider Notes (Addendum)
CSN: [id number]     Arrival date & time 05/08/15  1646 History   First MD Initiated Contact with Patient 05/08/15 1659     Chief Complaint  Patient presents with  . Generalized Body Aches     (Consider location/radiation/quality/duration/timing/severity/associated sxs/prior Treatment) HPI Complains of cough productive of green sputum, diffuse myalgias, headache, left ear pain and sore throat onset 2 days ago. Other associated symptoms include one episode of posttussive vomiting today. No other associated symptoms. Nothing makes symptoms better or worse no treatment prior to coming Past Medical History  Diagnosis Date  . Herpes   . Medical history non-contributory   . HSV infection   Past medical history herpes otherwise negative Past Surgical History  Procedure Laterality Date  . Wisdom teeth removal  2010   Family History  Problem Relation Age of Onset  . Diabetes Father   . Hypertension Maternal Grandmother   . Diabetes Maternal Grandfather   . Diabetes Paternal Grandfather    Social History  Substance Use Topics  . Smoking status: Former Games developermoker  . Smokeless tobacco: None  . Alcohol Use: No   OB History    Gravida Para Term Preterm AB TAB SAB Ectopic Multiple Living   1 1 1       0 1     Review of Systems  HENT: Positive for ear pain and sore throat.        Left ear pain  Respiratory: Positive for cough.   Gastrointestinal: Positive for vomiting.  Musculoskeletal: Positive for myalgias.  All other systems reviewed and are negative.     Allergies  Review of patient's allergies indicates no known allergies.  Home Medications   Prior to Admission medications   Not on File   BP 113/74 mmHg  Pulse 110  Temp(Src) 102.2 F (39 C) (Oral)  Resp 18  Ht 5\' 6"  (1.676 m)  Wt 125 lb (56.7 kg)  BMI 20.19 kg/m2  SpO2 98% Physical Exam  Constitutional: She is oriented to person, place, and time. She appears well-developed and well-nourished. No distress.  HENT:   Head: Normocephalic and atraumatic.  Right Ear: External ear normal.  Left Ear: External ear normal.  Mouth/Throat: No oropharyngeal exudate.  Bilateral tympanic membranes normal  Eyes: Conjunctivae are normal. Pupils are equal, round, and reactive to light.  Neck: Neck supple. No tracheal deviation present. No thyromegaly present.  Cardiovascular: Regular rhythm.   No murmur heard. Mildly tachycardic  Pulmonary/Chest: Effort normal and breath sounds normal.  Abdominal: Soft. Bowel sounds are normal. She exhibits no distension. There is no tenderness.  Musculoskeletal: Normal range of motion. She exhibits no edema or tenderness.  Neurological: She is alert and oriented to person, place, and time. Coordination normal.  Skin: Skin is warm and dry. No rash noted.  Psychiatric: She has a normal mood and affect.  Nursing note and vitals reviewed.   ED Course  Procedures (including critical care time) Labs Review Labs Reviewed  RAPID STREP SCREEN (NOT AT [facility])    Imaging Review No results found. I have personally reviewed and evaluated these images and lab results as part of my medical decision-making.   EKG Interpretation None     6:30 PM feels improved after treatment with Tylenol. She is in no distress. Chest x-ray viewed by me. Results for orders placed or performed during the hospital encounter of 05/08/15  Rapid strep screen  Result Value Ref Range   Streptococcus, Group A Screen (Direct) NEGATIVE NEGATIVE   Dg  Chest 2 View  05/08/2015  CLINICAL DATA:  Cough and fever EXAM: CHEST  2 VIEW COMPARISON:  None. FINDINGS: Normal heart size. Normal mediastinal contour. No pneumothorax. No pleural effusion. Mild patchy right upper lobe opacity. IMPRESSION: Mild patchy right upper lobe lung opacity, favor a mild right upper lobe pneumonia. Recommend follow-up PA and lateral post treatment chest radiographs in 4-6 weeks. Electronically Signed   By: Delbert Phenix M.D.   On: 05/08/2015  18:11    MDM  Plan Zithromax 500 mg by mouth prior to discharge. Patient is suitable for outpatient therapeutic. She is not ill-appearing, normal respiratory rate, normal pulse oximetry. Prescription Zithromax referral [place] including any wellness Center or local health department to get primary care physician. Tylenol for fever. Return if symptoms worsen or see PMD if continues to have fever by 05/13/2015 Diagnosis community-acquired pneumonia Final diagnoses:  None        Doug Sou, MD 05/08/15 1842  Doug Sou, MD 05/08/15 1610

## 2015-05-08 NOTE — Discharge Instructions (Signed)
Community-Acquired Pneumonia, Adult Take the antibiotic as prescribed starting tomorrow at 6 PM. Take Tylenol every 4 hours as needed for temperature higher than 100.4 while awake. Call the [facility] and community wellness Center or your local health department to get a primary care physician tomorrow. Get seen by her primary care physician if you still have a fever in 5 days. Return if you feel worse for any reason Pneumonia is an infection of the lungs. One type of pneumonia can happen while a person is in a hospital. A different type can happen when a person is not in a hospital (community-acquired pneumonia). It is easy for this kind to spread from person to person. It can spread to you if you breathe near an infected person who coughs or sneezes. Some symptoms include:  A dry cough.  A wet (productive) cough.  Fever.  Sweating.  Chest pain. HOME CARE  Take over-the-counter and prescription medicines only as told by your doctor.  Only take cough medicine if you are losing sleep.  If you were prescribed an antibiotic medicine, take it as told by your doctor. Do not stop taking the antibiotic even if you start to feel better.  Sleep with your head and neck raised (elevated). You can do this by putting a few pillows under your head, or you can sleep in a recliner.  Do not use tobacco products. These include cigarettes, chewing tobacco, and e-cigarettes. If you need help quitting, ask your doctor.  Drink enough water to keep your pee (urine) clear or pale yellow. A shot (vaccine) can help prevent pneumonia. Shots are often suggested for:  People older than 21 years of age.  People older than 21 years of age:  Who are having cancer treatment.  Who have long-term (chronic) lung disease.  Who have problems with their body's defense system (immune system). You may also prevent pneumonia if you take these actions:  Get the flu (influenza) shot every year.  Go to the dentist as  often as told.  Wash your hands often. If soap and water are not available, use hand sanitizer. GET HELP IF:  You have a fever.  You lose sleep because your cough medicine does not help. GET HELP RIGHT AWAY IF:  You are short of breath and it gets worse.  You have more chest pain.  Your sickness gets worse. This is very serious if:  You are an older adult.  Your body's defense system is weak.  You cough up blood.   This information is not intended to replace advice given to you by your health care provider. Make sure you discuss any questions you have with your health care provider.   Document Released: 07/15/2007 Document Revised: 10/17/2014 Document Reviewed: 05/23/2014 Elsevier Interactive Patient Education [employer]2016 Elsevier Inc.

## 2015-05-08 NOTE — ED Notes (Signed)
C/o cough, sore throat, body aches x 3 days-no fever/pain meds PTA-NAD-steady gait

## 2015-05-11 LAB — CULTURE, GROUP A STREP (THRC)
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
## 2016-08-08 ENCOUNTER — Encounter (HOSPITAL_BASED_OUTPATIENT_CLINIC_OR_DEPARTMENT_OTHER): Payer: Self-pay | Admitting: *Deleted

## 2016-08-08 ENCOUNTER — Emergency Department (HOSPITAL_BASED_OUTPATIENT_CLINIC_OR_DEPARTMENT_OTHER)
Admission: EM | Admit: 2016-08-08 | Discharge: 2016-08-08 | Disposition: A | Discharge status: Home / Self Care | Payer: Medicaid Other | Attending: Emergency Medicine | Admitting: Emergency Medicine

## 2016-08-08 DIAGNOSIS — Z79899 Other long term (current) drug therapy: Secondary | ICD-10-CM | POA: Insufficient documentation

## 2016-08-08 DIAGNOSIS — Z87891 Personal history of nicotine dependence: Secondary | ICD-10-CM | POA: Insufficient documentation

## 2016-08-08 DIAGNOSIS — K0889 Other specified disorders of teeth and supporting structures: Secondary | ICD-10-CM | POA: Diagnosis not present

## 2016-08-08 DIAGNOSIS — K029 Dental caries, unspecified: Secondary | ICD-10-CM

## 2016-08-08 MED ORDER — PENICILLIN V POTASSIUM 250 MG PO TABS
500.0000 mg | ORAL_TABLET | Freq: Once | ORAL | Status: AC
Start: 2016-08-08 — End: 2016-08-08
  Administered 2016-08-08: 500 mg via ORAL
  Filled 2016-08-08: qty 2

## 2016-08-08 MED ORDER — PENICILLIN V POTASSIUM 500 MG PO TABS: 500.0000 mg | ORAL_TABLET | Freq: Four times a day (QID) | ORAL | 0 refills | Status: AC

## 2016-08-08 MED ORDER — IBUPROFEN 800 MG PO TABS
800.0000 mg | ORAL_TABLET | Freq: Once | ORAL | Status: AC
  Administered 2016-08-08: 800 mg via ORAL
  Filled 2016-08-08: qty 1

## 2016-08-08 MED ORDER — TRAMADOL HCL 50 MG PO TABS: 50.0000 mg | ORAL_TABLET | Freq: Four times a day (QID) | ORAL | 0 refills | Status: DC | PRN

## 2016-08-08 NOTE — Discharge Instructions (Signed)
You may alternate Tylenol 1000 mg every 6 hours as needed for pain and Ibuprofen 800 mg every 8 hours as needed for pain.  Please take Ibuprofen with food.   You may use over-the-counter Orajel for dental pain as well.

## 2016-08-08 NOTE — ED Triage Notes (Signed)
Pt chipped upper left tooth last night presents with dental pain denies fever N/V or facial swelling

## 2016-08-08 NOTE — ED Provider Notes (Signed)
TIME SEEN: 1:23 AM  CHIEF COMPLAINT: Dental pain  HPI: Patient is a 21 year old female with no significant past medical history who presents the emergency department with one day of pain over the left upper third molar. States that she recently had have other teeth removed because of decay. She states she bit down on her food tonight and felt the tooth break slightly. She states that she has not had any facial swelling, difficulty swallowing or breathing. She has a Education officer, communitydentist for follow-up. Did not take any medication prior to arrival. No fever.  ROS: See HPI Constitutional: no fever  Eyes: no drainage  ENT: no runny nose   Cardiovascular:  no chest pain  Resp: no SOB  GI: no vomiting GU: no dysuria Integumentary: no rash  Allergy: no hives  Musculoskeletal: no leg swelling  Neurological: no slurred speech ROS otherwise negative  PAST MEDICAL HISTORY/PAST SURGICAL HISTORY:  Past Medical History:  Diagnosis Date  . Herpes   . HSV infection   . Medical history non-contributory     MEDICATIONS:  Prior to Admission medications   Medication Sig Start Date End Date Taking? Authorizing Provider  medroxyPROGESTERone (DEPO-PROVERA) 150 MG/ML injection Inject 150 mg into the muscle every 3 (three) months.   Yes [provider]    ALLERGIES:  No Known Allergies  SOCIAL HISTORY:  Social History  Substance Use Topics  . Smoking status: Former Games developermoker  . Smokeless tobacco: Never Used  . Alcohol use No    FAMILY HISTORY: Family History  Problem Relation Age of Onset  . Diabetes Father   . Diabetes Maternal Grandfather   . Hypertension Maternal Grandmother   . Diabetes Paternal Grandfather     EXAM: BP 132/86   Pulse 72   Temp 98.5 F (36.9 C) (Oral)   Resp 16   Ht 5\' 5"  (1.651 m)   Wt 59 kg (130 lb)   LMP 08/07/2016   SpO2 97%   BMI 21.63 kg/m  CONSTITUTIONAL: Alert and oriented and responds appropriately to questions. Well-appearing; well-nourished HEAD:  Normocephalic EYES: Conjunctivae clear, pupils appear equal, EOMI ENT: normal nose; moist mucous membranes; No pharyngeal erythema or petechiae, no tonsillar hypertrophy or exudate, no uvular deviation, no unilateral swelling, no trismus or drooling, no muffled voice, normal phonation, no stridor, multiple dental caries present, patient is tender over the left upper third molar with signs of decay but no significant fracture or dentin exposure, no drainable dental abscess noted, no Ludwig's angina, tongue sits flat in the bottom of the mouth, no angioedema, no facial erythema or warmth, no facial swelling; no pain with movement of the neck. NECK: Supple, no meningismus, no nuchal rigidity, no LAD  CARD: RRR; S1 and S2 appreciated; no murmurs, no clicks, no rubs, no gallops RESP: Normal chest excursion without splinting or tachypnea; breath sounds clear and equal bilaterally; no wheezes, no rhonchi, no rales, no hypoxia or respiratory distress, speaking full sentences ABD/GI: Normal bowel sounds; non-distended; soft, non-tender, no rebound, no guarding, no peritoneal signs, no hepatosplenomegaly BACK:  The back appears normal and is non-tender to palpation, there is no CVA tenderness EXT: Normal ROM in all joints; non-tender to palpation; no edema; normal capillary refill; no cyanosis, no calf tenderness or swelling    SKIN: Normal color for age and race; warm; no rash NEURO: Moves all extremities equally PSYCH: The patient's mood and manner are appropriate. Grooming and personal hygiene are appropriate.  MEDICAL DECISION MAKING: Patient here with dental pain from dental  caries. We'll place her on prophylactic penicillin and give her dental follow-up. Recommended alternating Tylenol and Motrin for pain. We'll discharge her short course of tramadol for pain control given she does have a small chip in this tooth. Have recommended over-the-counter Orajel. This small chip does not appear to be amendable to  any dental paste. There is no sign of any dentin exposed at this time. Discussed return precautions. She is comfortable with this plan. There is no sign of any Ludwig's angina, facial cellulitis at this time.  At this time, I do not feel there is any life-threatening condition present. I have reviewed and discussed all results (EKG, imaging, lab, urine as appropriate) and exam findings with patient/family. I have reviewed nursing notes and appropriate previous records.  I feel the patient is safe to be discharged home without further emergent workup and can continue workup as an outpatient as needed. Discussed usual and customary return precautions. Patient/family verbalize understanding and are comfortable with this plan.  Outpatient follow-up has been provided if needed. All questions have been answered.      Shakeerah Gradel, Layla Maw, DO 08/08/16 [phone]

## 2016-09-21 ENCOUNTER — Encounter (HOSPITAL_BASED_OUTPATIENT_CLINIC_OR_DEPARTMENT_OTHER): Payer: Self-pay | Admitting: *Deleted

## 2016-09-21 ENCOUNTER — Emergency Department (HOSPITAL_BASED_OUTPATIENT_CLINIC_OR_DEPARTMENT_OTHER): Payer: Medicaid Other

## 2016-09-21 ENCOUNTER — Emergency Department (HOSPITAL_BASED_OUTPATIENT_CLINIC_OR_DEPARTMENT_OTHER)
Admission: EM | Admit: 2016-09-21 | Discharge: 2016-09-21 | Disposition: A | Discharge status: Home / Self Care | Payer: Medicaid Other | Attending: Emergency Medicine | Admitting: Emergency Medicine

## 2016-09-21 DIAGNOSIS — Y9389 Activity, other specified: Secondary | ICD-10-CM | POA: Diagnosis not present

## 2016-09-21 DIAGNOSIS — Y92410 Unspecified street and highway as the place of occurrence of the external cause: Secondary | ICD-10-CM | POA: Diagnosis not present

## 2016-09-21 DIAGNOSIS — R51 Headache: Secondary | ICD-10-CM | POA: Diagnosis not present

## 2016-09-21 DIAGNOSIS — R519 Headache, unspecified: Secondary | ICD-10-CM

## 2016-09-21 DIAGNOSIS — R079 Chest pain, unspecified: Secondary | ICD-10-CM | POA: Diagnosis not present

## 2016-09-21 DIAGNOSIS — Z87891 Personal history of nicotine dependence: Secondary | ICD-10-CM | POA: Insufficient documentation

## 2016-09-21 DIAGNOSIS — S060X0A Concussion without loss of consciousness, initial encounter: Secondary | ICD-10-CM | POA: Diagnosis not present

## 2016-09-21 DIAGNOSIS — Y999 Unspecified external cause status: Secondary | ICD-10-CM | POA: Insufficient documentation

## 2016-09-21 MED ORDER — CYCLOBENZAPRINE HCL 10 MG PO TABS: 10.0000 mg | ORAL_TABLET | Freq: Two times a day (BID) | ORAL | 0 refills | Status: DC | PRN
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
## 2017-03-05 ENCOUNTER — Emergency Department (HOSPITAL_BASED_OUTPATIENT_CLINIC_OR_DEPARTMENT_OTHER)
Admission: EM | Admit: 2017-03-05 | Discharge: 2017-03-05 | Disposition: A | Discharge status: Home / Self Care | Payer: Medicaid Other | Attending: Emergency Medicine | Admitting: Emergency Medicine

## 2017-03-05 ENCOUNTER — Other Ambulatory Visit: Payer: Self-pay

## 2017-03-05 ENCOUNTER — Encounter: Payer: Self-pay | Admitting: Emergency Medicine

## 2017-03-05 DIAGNOSIS — Z202 Contact with and (suspected) exposure to infections with a predominantly sexual mode of transmission: Secondary | ICD-10-CM | POA: Diagnosis not present

## 2017-03-05 DIAGNOSIS — N898 Other specified noninflammatory disorders of vagina: Secondary | ICD-10-CM | POA: Diagnosis not present

## 2017-03-05 DIAGNOSIS — Z79899 Other long term (current) drug therapy: Secondary | ICD-10-CM | POA: Diagnosis not present

## 2017-03-05 DIAGNOSIS — Z87891 Personal history of nicotine dependence: Secondary | ICD-10-CM | POA: Insufficient documentation

## 2017-03-05 DIAGNOSIS — F4323 Adjustment disorder with mixed anxiety and depressed mood: Secondary | ICD-10-CM | POA: Diagnosis not present

## 2017-03-05 DIAGNOSIS — Z711 Person with feared health complaint in whom no diagnosis is made: Secondary | ICD-10-CM

## 2017-03-05 LAB — PREGNANCY, URINE: Preg Test, Ur: NEGATIVE

## 2017-03-05 LAB — URINALYSIS, ROUTINE W REFLEX MICROSCOPIC
BILIRUBIN URINE: NEGATIVE
Glucose, UA: NEGATIVE mg/dL
HGB URINE DIPSTICK: NEGATIVE
KETONES UR: NEGATIVE mg/dL
Leukocytes, UA: NEGATIVE
Nitrite: NEGATIVE
PH: 8.5 — AB (ref 5.0–8.0)
Protein, ur: NEGATIVE mg/dL
SPECIFIC GRAVITY, URINE: 1.02 (ref 1.005–1.030)

## 2017-03-05 LAB — WET PREP, GENITAL
SPERM: NONE SEEN
Trich, Wet Prep: NONE SEEN
Yeast Wet Prep HPF POC: NONE SEEN

## 2017-03-05 MED ORDER — AZITHROMYCIN 250 MG PO TABS
1000.0000 mg | ORAL_TABLET | Freq: Once | ORAL | Status: AC
  Administered 2017-03-05: 1000 mg via ORAL
  Filled 2017-03-05: qty 4

## 2017-03-05 MED ORDER — METRONIDAZOLE 500 MG PO TABS: 500.0000 mg | ORAL_TABLET | Freq: Two times a day (BID) | ORAL | 0 refills | Status: DC

## 2017-03-05 MED ORDER — CEFTRIAXONE SODIUM 250 MG IJ SOLR
250.0000 mg | Freq: Once | INTRAMUSCULAR | Status: AC
  Administered 2017-03-05: 250 mg via INTRAMUSCULAR
  Filled 2017-03-05: qty 250

## 2017-03-05 NOTE — ED Provider Notes (Signed)
[facility] EMERGENCY DEPARTMENT Provider Note   CSN: [id number] Arrival date & time: 03/05/17  1504     History   Chief Complaint Chief Complaint  Patient presents with  . Vaginal Discharge    HPI "I want a blood test for HIV and Syphilis"  Natasha Padilla is a 22 y.o. female who presents with vaginal discharge and discomfort. PMH significant for HSV-2, hx of Gonorrhea and Chlamydia. Past surgical hx significant for removal of a teratoma and surgery for endometriosis. She states that she has had three different sexual partners this year. She uses condoms every time. Over the past couple of days she has noticed a thick, white discharge. She also has some vaginal itching. She has dysparunia as well but states this has been an ongoing issue since her surgery. She denies fever, chills, abdominal pain, N/V/D, dysuria. She is not on birth control because it makes her "emotional".  HPI  Past Medical History:  Diagnosis Date  . Herpes   . HSV infection   . Medical history non-contributory     Patient Active Problem List   Diagnosis Date Noted  . 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 Adjustment disorder with mixed anxiety and depressed mood 04/17/2014  . Vaginal delivery 04/15/2014  . GERD (gastroesophageal reflux disease) 04/12/2014  . Hx of herpes genitalis 04/12/2014  . Pregnancy complicated by fetal GU abnormality   . Encounter for supervision of normal first pregnancy in third trimester 03/29/2014  . Fetal hydronephrosis in pregnancy, antepartum condition   . Low amniotic fluid   . Renal anomaly of fetus   . Pregnancy complicated by fetal GI abnormality     Past Surgical History:  Procedure Laterality Date  . wisdom teeth removal  2010    OB History    Gravida Para Term Preterm AB Living   1 1 1     1    SAB TAB Ectopic Multiple Live Births         0 1       Home Medications    Prior to Admission medications   Medication Sig Start Date End Date  Taking? Authorizing Provider  cyclobenzaprine (FLEXERIL) 10 MG tablet Take 1 tablet (10 mg total) by mouth 2 (two) times daily as needed for muscle spasms. 09/21/16   Tegeler, Canary Brimhristopher J, MD  medroxyPROGESTERone (DEPO-PROVERA) 150 MG/ML injection Inject 150 mg into the muscle every 3 (three) months.    [provider]  metroNIDAZOLE (FLAGYL) 500 MG tablet Take 1 tablet (500 mg total) by mouth 2 (two) times daily. 03/05/17   Bethel BornGekas, Michaila Kenney Marie, PA-C  traMADol (ULTRAM) 50 MG tablet Take 1 tablet (50 mg total) by mouth every 6 (six) hours as needed. 08/08/16   Ward, Layla MawKristen N, DO    Family History Family History  Problem Relation Age of Onset  . Diabetes Father   . Diabetes Maternal Grandfather   . Hypertension Maternal Grandmother   . Diabetes Paternal Grandfather     Social History Social History   Tobacco Use  . Smoking status: Former Games developermoker  . Smokeless tobacco: Never Used  Substance Use Topics  . Alcohol use: No  . Drug use: No     Allergies   Patient has no known allergies.   Review of Systems Review of Systems  Constitutional: Negative for fever.  Gastrointestinal: Negative for abdominal pain, nausea and vomiting.  Genitourinary: Positive for dyspareunia, vaginal discharge and vaginal pain (discomfort). Negative for dysuria, genital sores, pelvic pain and vaginal  bleeding.  All other systems reviewed and are negative.    Physical Exam Updated Vital Signs BP 139/74   Pulse 78   Temp 98.1 F (36.7 C) (Oral)   Resp 18   Ht 5\' 5"  (1.651 m)   Wt 61.2 kg (135 lb)   SpO2 100%   BMI 22.47 kg/m   Physical Exam  Constitutional: She is oriented to person, place, and time. She appears well-developed and well-nourished. No distress.  Well appearing. Texting during exam  HENT:  Head: Normocephalic and atraumatic.  Eyes: Conjunctivae are normal. Pupils are equal, round, and reactive to light. Right eye exhibits no discharge. Left eye exhibits no discharge. No  scleral icterus.  Neck: Normal range of motion.  Cardiovascular: Normal rate and regular rhythm. Exam reveals no gallop and no friction rub.  No murmur heard. Pulmonary/Chest: Effort normal and breath sounds normal. No stridor. No respiratory distress. She has no wheezes. She has no rales. She exhibits no tenderness.  Abdominal: Soft. Bowel sounds are normal. She exhibits no distension. There is no tenderness.  Genitourinary:  Genitourinary Comments: Pelvic: No inguinal lymphadenopathy or inguinal hernia noted. Normal external genitalia. No pain with speculum insertion. Closed cervical os with normal appearance - no rash or lesions. No significant discharge or bleeding noted from cervix or in vaginal vault. On bimanual examination no adnexal tenderness or cervical motion tenderness. Chaperone present during exam.    Neurological: She is alert and oriented to person, place, and time.  Skin: Skin is warm and dry.  Psychiatric: She has a normal mood and affect. Her behavior is normal.  Nursing note and vitals reviewed.    ED Treatments / Results  Labs (all labs ordered are listed, but only abnormal results are displayed) Labs Reviewed  WET PREP, GENITAL - Abnormal; Notable for the following components:      Result Value   Clue Cells Wet Prep HPF POC PRESENT (*)    WBC, Wet Prep HPF POC MANY (*)    All other components within normal limits  URINALYSIS, ROUTINE W REFLEX MICROSCOPIC - Abnormal; Notable for the following components:   APPearance HAZY (*)    pH 8.5 (*)    All other components within normal limits  PREGNANCY, URINE  HIV ANTIBODY (ROUTINE TESTING)  RPR  GC/CHLAMYDIA PROBE AMP ([facility]) NOT AT [facility]    EKG  EKG Interpretation None       Radiology No results found.  Procedures Procedures (including critical care time)  Medications Ordered in ED Medications  cefTRIAXone (ROCEPHIN) injection 250 mg (250 mg Intramuscular Given 03/05/17 1900)  azithromycin  (ZITHROMAX) tablet 1,000 mg (1,000 mg Oral Given 03/05/17 1900)     Initial Impression / Assessment and Plan / ED Course  I have reviewed the triage vital signs and the nursing notes.  Pertinent labs & imaging results that were available during my care of the patient were reviewed by me and considered in my medical decision making (see chart for details).  22 year old female presents with vaginal discharge and discomfort. Vitals are normal. Full STD screen was obtained. Pelvic exam was unremarkable. Low suspicion for PID however will treat empirically for G&C due to patient's history. Will also treat for BV as there were clue cells on Wet prep. She was advised to follow up with her OBGYN and that results will be available on MyChart in several days.  Final Clinical Impressions(s) / ED Diagnoses   Final diagnoses:  Vaginal discharge  Concern about  STD in female without diagnosis    ED Discharge Orders        Ordered    metroNIDAZOLE (FLAGYL) 500 MG tablet  2 times daily     03/05/17 1933       Bethel Born, PA-C 03/05/17 1948    Arby Barrette, MD 03/09/17 [phone]

## 2017-03-05 NOTE — Discharge Instructions (Addendum)
Take Flagyl twice a day for the next week for vaginal discharge If your test is abnormal, you will be called, but you have been treated for Gonorrhea and Chlamydia today. You can also review your results on MyChart Practice safe sex and use a condom to prevent infection or unwanted pregnancy Follow up with the Health Department

## 2017-03-05 NOTE — ED Triage Notes (Signed)
Presents with vaginal discomfort that began one week ago, endorses vaginal d/c that is white, thick and itchy as well as well as constipation and pain with BM, she states she states she has herpes and is unsure if she is having a outbreak, but has not see any vesicles or sores.

## 2017-03-06 LAB — RPR: RPR Ser Ql: NONREACTIVE

## 2017-03-06 LAB — HIV ANTIBODY (ROUTINE TESTING W REFLEX): HIV Screen 4th Generation wRfx: NONREACTIVE

## 2017-03-08 LAB — GC/CHLAMYDIA PROBE AMP (~~LOC~~) NOT AT ARMC
Chlamydia: POSITIVE — AB
Neisseria Gonorrhea: NEGATIVE

## 2018-08-25 ENCOUNTER — Emergency Department (HOSPITAL_BASED_OUTPATIENT_CLINIC_OR_DEPARTMENT_OTHER)
Admission: EM | Admit: 2018-08-25 | Discharge: 2018-08-26 | Disposition: A | Discharge status: Home / Self Care | Payer: Medicaid Other | Attending: Emergency Medicine | Admitting: Emergency Medicine

## 2018-08-25 ENCOUNTER — Encounter (HOSPITAL_BASED_OUTPATIENT_CLINIC_OR_DEPARTMENT_OTHER): Payer: Self-pay

## 2018-08-25 ENCOUNTER — Other Ambulatory Visit: Payer: Self-pay

## 2018-08-25 DIAGNOSIS — L299 Pruritus, unspecified: Secondary | ICD-10-CM | POA: Diagnosis not present

## 2018-08-25 DIAGNOSIS — Z79899 Other long term (current) drug therapy: Secondary | ICD-10-CM | POA: Diagnosis not present

## 2018-08-25 DIAGNOSIS — Z87891 Personal history of nicotine dependence: Secondary | ICD-10-CM | POA: Insufficient documentation

## 2018-08-25 NOTE — ED Triage Notes (Signed)
C/o itching all over x 4 days-NAD-steady gait

## 2018-08-26 ENCOUNTER — Telehealth (HOSPITAL_BASED_OUTPATIENT_CLINIC_OR_DEPARTMENT_OTHER): Payer: Self-pay | Admitting: Emergency Medicine

## 2018-08-26 LAB — GROUP A STREP BY PCR: Group A Strep by PCR: NOT DETECTED

## 2018-08-26 MED ORDER — DOXEPIN HCL 10 MG PO CAPS: 10.0000 mg | ORAL_CAPSULE | Freq: Three times a day (TID) | ORAL | 0 refills | Status: AC

## 2018-08-26 MED ORDER — PREDNISONE 20 MG PO TABS: 60.0000 mg | ORAL_TABLET | Freq: Every day | ORAL | 0 refills | Status: DC

## 2018-08-26 MED ORDER — PREDNISONE 50 MG PO TABS
60.0000 mg | ORAL_TABLET | Freq: Once | ORAL | Status: AC
  Administered 2018-08-26: 01:00:00 60 mg via ORAL
  Filled 2018-08-26: qty 1

## 2018-08-26 NOTE — ED Notes (Signed)
ED Provider at bedside. 

## 2018-08-26 NOTE — Telephone Encounter (Signed)
Called patient with results. Verbally acknowledged, reviewed plan of care with patient.

## 2018-08-26 NOTE — Discharge Instructions (Addendum)
You did not want to wait for the strep test result. Please check that result on MyChart - if it is positive, then you will need to return to get antibiotics.

## 2018-08-26 NOTE — ED Provider Notes (Signed)
[facility] EMER[facility]Y DEPART[facility] Provider Note   CSN: [id number] Arrival date & time: 08/25/18  2233    History   Chief Complaint Chief Complaint  Patient presents with  . Pruritis    HPI Natasha Padilla is a 23 y.o. female. She complains of generalized itching for the last 3 days which has gotten worse.  She has not noted any rash.  She denies any sore throat denies any difficulty breathing or swallowing.  She denies any new exposures.  She has taken diphenhydramine without relief.  The history is provided by the patient.    Past Medical History:  Diagnosis Date  . Herpes   . HSV infection   . Medical history non-contributory     Patient Active Problem List   Diagnosis Date Noted  . 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 Adjustment disorder with mixed anxiety and depressed mood 04/17/2014  . Vaginal delivery 04/15/2014  . GERD (gastroesophageal reflux disease) 04/12/2014  . Hx of herpes genitalis 04/12/2014  . Pregnancy complicated by fetal GU abnormality   . Encounter for supervision of normal first pregnancy in third trimester 03/29/2014  . Fetal hydronephrosis in pregnancy, antepartum condition   . Low amniotic fluid   . Renal anomaly of fetus   . Pregnancy complicated by fetal GI abnormality     Past Surgical History:  Procedure Laterality Date  . wisdom teeth removal  2010     OB History    Gravida  1   Para  1   Term  1   Preterm      AB      Living  1     SAB      TAB      Ectopic      Multiple  0   Live Births  1            Home Medications    Prior to Admission medications   Medication Sig Start Date End Date Taking? Authorizing Provider  cyclobenzaprine (FLEXERIL) 10 MG tablet Take 1 tablet (10 mg total) by mouth 2 (two) times daily as needed for muscle spasms. 09/21/16   Tegeler, Gwenyth Allegra, MD  medroxyPROGESTERone (DEPO-PROVERA) 150 MG/ML injection Inject 150 mg into the muscle every 3 (three) months.     [provider]  metroNIDAZOLE (FLAGYL) 500 MG tablet Take 1 tablet (500 mg total) by mouth 2 (two) times daily. 03/05/17   Recardo Evangelist, PA-C  traMADol (ULTRAM) 50 MG tablet Take 1 tablet (50 mg total) by mouth every 6 (six) hours as needed. 08/08/16   Ward, Delice Bison, DO    Family History Family History  Problem Relation Age of Onset  . Diabetes Father   . Diabetes Maternal Grandfather   . Hypertension Maternal Grandmother   . Diabetes Paternal Grandfather     Social History Social History   Tobacco Use  . Smoking status: Former Research scientist (life sciences)  . Smokeless tobacco: Never Used  Substance Use Topics  . Alcohol use: No  . Drug use: No     Allergies   Banana   Review of Systems Review of Systems  All other systems reviewed and are negative.    Physical Exam Updated Vital Signs BP 111/76 (BP Location: Left Arm)   Pulse 80   Temp 98.3 F (36.8 C) (Oral)   Resp 16   Ht 5\' 5"  (1.651 m)   Wt 73 kg   LMP 08/14/2018   SpO2 98%   BMI 26.79  kg/m   Physical Exam Vitals signs and nursing note reviewed.    23 year old female, resting comfortably and in no acute distress. Vital signs are normal. Oxygen saturation is 98%, which is normal. Head is normocephalic and atraumatic. PERRLA, EOMI. Oropharynx is clear. Neck is nontender and supple.  Bilateral shotty posterior cervical adenopathy present. Back is nontender and there is no CVA tenderness. Lungs are clear without rales, wheezes, or rhonchi. Chest is nontender. Heart has regular rate and rhythm without murmur. Abdomen is soft, flat, nontender without masses or hepatosplenomegaly and peristalsis is normoactive. Extremities have no cyanosis or edema, full range of motion is present. Skin is warm and dry without rash. Neurologic: Mental status is normal, cranial nerves are intact, there are no motor or sensory deficits.  ED Treatments / Results  Labs (all labs ordered are listed, but only abnormal results are  displayed) Labs Reviewed  GROUP A STREP BY PCR    Procedures Procedures  Medications Ordered in ED Medications  predniSONE (DELTASONE) tablet 60 mg (has no administration in time range)     Initial Impression / Assessment and Plan / ED Course  I have reviewed the triage vital signs and the nursing notes.  Pertinent lab results that were available during my care of the patient were reviewed by me and considered in my medical decision making (see chart for details).  Pruritus of uncertain cause.  No obvious rash on exam.  However, posterior cervical adenopathy is concerning for possible occult strep infection.  Will check strep screen.  We will also start on prednisone.  Old records are reviewed, and she has no relevant past visits.  Strep screen has been obtained, the patient is not willing to wait for results.  She is discharged with prescription for prednisone and doxepin, advised to check for report on MyChart and, if positive, return to get prescription for antibiotics.  Final Clinical Impressions(s) / ED Diagnoses   Final diagnoses:  Pruritus    ED Discharge Orders         Ordered    predniSONE (DELTASONE) 20 MG tablet  Daily     08/26/18 0103    doxepin (SINEQUAN) 10 MG capsule  3 times daily     08/26/18 0103           Dione BoozeGlick, Elray Dains, MD 08/26/18 [id number]

## 2018-08-26 NOTE — ED Notes (Addendum)
Pt states she does not want to stay until test results come back. EDP made aware.  Pt also upset with length of stay, "y'all didn't look at my rash." Performed full assessment, pt has multiple scratches to body and some bug bites on the back, no rash visualized at this time.

## 2019-08-29 ENCOUNTER — Emergency Department (HOSPITAL_BASED_OUTPATIENT_CLINIC_OR_DEPARTMENT_OTHER): Payer: Medicaid Other

## 2019-08-29 ENCOUNTER — Other Ambulatory Visit: Payer: Self-pay

## 2019-08-29 ENCOUNTER — Emergency Department (HOSPITAL_BASED_OUTPATIENT_CLINIC_OR_DEPARTMENT_OTHER)
Admission: EM | Admit: 2019-08-29 | Discharge: 2019-08-29 | Disposition: A | Discharge status: Home / Self Care | Payer: Medicaid Other | Attending: Emergency Medicine | Admitting: Emergency Medicine

## 2019-08-29 ENCOUNTER — Encounter (HOSPITAL_BASED_OUTPATIENT_CLINIC_OR_DEPARTMENT_OTHER): Payer: Self-pay | Admitting: Emergency Medicine

## 2019-08-29 DIAGNOSIS — Z87891 Personal history of nicotine dependence: Secondary | ICD-10-CM | POA: Diagnosis not present

## 2019-08-29 DIAGNOSIS — M79644 Pain in right finger(s): Secondary | ICD-10-CM | POA: Insufficient documentation

## 2019-08-29 MED ORDER — ACETAMINOPHEN 325 MG PO TABS
650.0000 mg | ORAL_TABLET | Freq: Once | ORAL | Status: AC
  Administered 2019-08-29: 07:00:00 650 mg via ORAL
  Filled 2019-08-29: qty 2

## 2019-08-29 MED ORDER — NAPROXEN 250 MG PO TABS
500.0000 mg | ORAL_TABLET | Freq: Once | ORAL | Status: AC
  Administered 2019-08-29: 07:00:00 500 mg via ORAL
  Filled 2019-08-29: qty 2

## 2019-08-29 NOTE — Discharge Instructions (Signed)
Wear the splint as needed.  Apply ice for thirty minutes at a time, four times a day.  Take two naproxen tablets at a time, twice a day.  Take acetaminophen as needed for additional pain relief.

## 2019-08-29 NOTE — ED Triage Notes (Signed)
Pt states she is having pain in her right hand  Denies injury  Pt is a cashier  Pt has slight swelling noted to her thumb

## 2019-08-29 NOTE — ED Provider Notes (Signed)
  Provider Note MRN:  [id number]  Arrival date & time: 08/29/19    ED Course and Medical Decision Making  Assumed care from Dr. Preston Fleeting at shift change.  X-ray is without any bony abnormalities, soft tissue edema.  On my evaluation there is no signs of infection, there is preserved range of motion of the joints, patient explains she has had sprains of joints in the past without remembering how she did it.  Advised NSAIDs, rest and and follow-up in 1 or 2 weeks if not improving.  Procedures  Final Clinical Impressions(s) / ED Diagnoses     ICD-10-CM   1. Pain of right thumb  M79.644     ED Discharge Orders    None        Discharge Instructions     Wear the splint as needed.  Apply ice for thirty minutes at a time, four times a day.  Take two naproxen tablets at a time, twice a day.  Take acetaminophen as needed for additional pain relief.    Elmer Sow. Pilar Plate, MD [facility] [email or web address]    Sabas Sous, MD 08/29/19 [phone]

## 2019-08-29 NOTE — ED Provider Notes (Signed)
[facility] EMERGENCY DEPARTMENT Provider Note   CSN: [id number] Arrival date & time: 08/29/19  0217   History Chief Complaint  Patient presents with  . Hand Pain    [name] is a 24 y.o. female.  The history is provided by the patient.  Hand Pain  She comes in complaining of pain in her right thumb for the last 3 days.  It is getting worse.  It is worse with palpation or any movement.  She has applied ice without any benefit.  She denies any trauma or unusual activity.  Pain is rated at 10/10.  Past Medical History:  Diagnosis Date  . Herpes   . HSV infection   . Medical history non-contributory     Patient Active Problem List   Diagnosis Date Noted  . 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 Adjustment disorder with mixed anxiety and depressed mood 04/17/2014  . Vaginal delivery 04/15/2014  . GERD (gastroesophageal reflux disease) 04/12/2014  . Hx of herpes genitalis 04/12/2014  . Pregnancy complicated by fetal GU abnormality   . Encounter for supervision of normal first pregnancy in third trimester 03/29/2014  . Fetal hydronephrosis in pregnancy, antepartum condition   . Low amniotic fluid   . Renal anomaly of fetus   . Pregnancy complicated by fetal GI abnormality     Past Surgical History:  Procedure Laterality Date  . laproscopic edometriosis surgery    . wisdom teeth removal  2010     OB History    Gravida  1   Para  1   Term  1   Preterm      AB      Living  1     SAB      TAB      Ectopic      Multiple  0   Live Births  1           Family History  Problem Relation Age of Onset  . Diabetes Father   . Diabetes Maternal Grandfather   . Hypertension Maternal Grandmother   . Diabetes Paternal Grandfather     Social History   Tobacco Use  . Smoking status: Former Games developer  . Smokeless tobacco: Never Used  Vaping Use  . Vaping Use: Never used  Substance Use Topics  . Alcohol use: No  . Drug use: No     Home Medications Prior to Admission medications   Medication Sig Start Date End Date Taking? Authorizing Provider  doxepin (SINEQUAN) 10 MG capsule Take 1 capsule (10 mg total) by mouth 3 (three) times daily. 08/26/18   Dione Booze, MD  medroxyPROGESTERone (DEPO-PROVERA) 150 MG/ML injection Inject 150 mg into the muscle every 3 (three) months.    [provider]  predniSONE (DELTASONE) 20 MG tablet Take 3 tablets (60 mg total) by mouth daily. 08/26/18   Dione Booze, MD    Allergies    Banana  Review of Systems   Review of Systems  All other systems reviewed and are negative.   Physical Exam Updated Vital Signs BP (!) 115/54 (BP Location: Left Arm)   Pulse 62   Temp 98.2 F (36.8 C) (Oral)   Resp 14   Ht 5\' 5"  (1.651 m)   Wt 80.3 kg   LMP 08/27/2019 (Exact Date)   SpO2 100%   BMI 29.45 kg/m   Physical Exam Vitals and nursing note reviewed.   24 year old female, resting comfortably and in no acute distress. Vital  signs are normal. Oxygen saturation is 100%, which is normal. Head is normocephalic and atraumatic. PERRLA, EOMI. Oropharynx is clear. Neck is nontender and supple without adenopathy or JVD. Back is nontender and there is no CVA tenderness. Lungs are clear without rales, wheezes, or rhonchi. Chest is nontender. Heart has regular rate and rhythm without murmur. Abdomen is soft, flat, nontender without masses or hepatosplenomegaly and peristalsis is normoactive. Extremities: There is moderate swelling of the right thumb extending into the thenar eminence and proximal phalanx.  There is tenderness palpation throughout this area.  There is pain in any passive range of motion of the thumb.  Distal neurovascular is intact with prompt capillary refill and normal sensation. Skin is warm and dry without rash. Neurologic: Mental status is normal, cranial nerves are intact, there are no motor or sensory deficits.  ED Results / Procedures / Treatments     Radiology No results found.  Procedures Procedures  Medications Ordered in ED Medications  naproxen (NAPROSYN) tablet 500 mg (has no administration in time range)  acetaminophen (TYLENOL) tablet 650 mg (has no administration in time range)    ED Course  I have reviewed the triage vital signs and the nursing notes.  Pertinent imaging results that were available during my care of the patient were reviewed by me and considered in my medical decision making (see chart for details).  MDM Rules/Calculators/A&P Pain and swelling of the right thumb, etiology unclear.  No history of trauma, but will check x-ray just to be sure no occult injuries occurred.  Anticipate placing her in a thumb splint and referring to hand surgery for follow-up.  Old records are reviewed, and she has no relevant past visits.  I have reviewed the x-rays and see some soft tissue swelling but no evidence of any other acute process.  Radiologist interpretation is pending.  Case is signed out to Dr. Pilar Plate.  Final Clinical Impression(s) / ED Diagnoses Final diagnoses:  Pain of right thumb    Rx / DC Orders ED Discharge Orders    None       Dione Booze, MD 08/29/19 [phone]

## 2020-04-28 ENCOUNTER — Encounter (HOSPITAL_BASED_OUTPATIENT_CLINIC_OR_DEPARTMENT_OTHER): Payer: Self-pay | Admitting: Emergency Medicine

## 2020-04-28 ENCOUNTER — Emergency Department (HOSPITAL_BASED_OUTPATIENT_CLINIC_OR_DEPARTMENT_OTHER)
Admission: EM | Admit: 2020-04-28 | Discharge: 2020-04-28 | Disposition: A | Discharge status: Home / Self Care | Payer: Medicaid Other | Attending: Emergency Medicine | Admitting: Emergency Medicine

## 2020-04-28 ENCOUNTER — Other Ambulatory Visit: Payer: Self-pay

## 2020-04-28 DIAGNOSIS — R0981 Nasal congestion: Secondary | ICD-10-CM | POA: Diagnosis present

## 2020-04-28 DIAGNOSIS — J310 Chronic rhinitis: Secondary | ICD-10-CM

## 2020-04-28 DIAGNOSIS — Z87891 Personal history of nicotine dependence: Secondary | ICD-10-CM | POA: Diagnosis not present

## 2020-04-28 DIAGNOSIS — J329 Chronic sinusitis, unspecified: Secondary | ICD-10-CM | POA: Insufficient documentation

## 2020-04-28 NOTE — Discharge Instructions (Signed)
Is important you stay hydrated. You can use a steroid nasal spray such as Flonase to help with congestion. Mucinex DM, ibuprofen, antihistamines. Your Medicaid card should have the name of a PCP for you to establish care for regular medical management.

## 2020-04-28 NOTE — ED Triage Notes (Signed)
Pt arrives pov with c/o nasal congestion and pressure. Pt denies fever. Pt endorses hx of broken

## 2020-04-28 NOTE — ED Notes (Signed)
ED Provider at bedside. 

## 2020-04-28 NOTE — ED Provider Notes (Signed)
[facility] EMERGENCY DEPARTMENT Provider Note   CSN: [id number] Arrival date & time: 04/28/20  1749     History Chief Complaint  Patient presents with   Facial Pain   Nasal Congestion    Natasha Padilla is a 25 y.o. female presenting to the emergency department for evaluation of nasal congestion.  She states symptoms began about Tuesday last week.  Patient states initially she felt her symptoms were attributed to allergies.  She treated with allergy medications and Mucinex, however has not had much relief.  States her mucus is clear sometimes will light yellow.  She has associated postnasal drip which is irritating her throat, and ear and sinus pressure.  She also endorses watery eyes.  No fevers or chills, no cough.  She states she broke her nose back in June he has been having difficulty with congestion and breathing out of her nose since then.  She states she was evaluated by ENT who said everything was fine.  The history is provided by the patient.       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   Adjustment disorder with mixed anxiety and depressed mood 04/17/2014   Vaginal delivery 04/15/2014   GERD (gastroesophageal reflux disease) 04/12/2014   Hx of herpes genitalis 04/12/2014   Pregnancy complicated by fetal GU abnormality    Encounter for supervision of normal first pregnancy in third trimester 03/29/2014   Fetal hydronephrosis in pregnancy, antepartum condition    Low amniotic fluid    Renal anomaly of fetus    Pregnancy complicated by fetal GI abnormality     Past Surgical History:  Procedure Laterality Date   laproscopic edometriosis surgery     wisdom teeth removal  2010     OB History    Gravida  1   Para  1   Term  1   Preterm      AB      Living  1      SAB      IAB      Ectopic      Multiple  0   Live Births  1           Family History  Problem Relation Age of Onset   Diabetes Father    Diabetes Maternal Grandfather    Hypertension Maternal Grandmother    Diabetes Paternal Grandfather     Social History   Tobacco Use   Smoking status: Former Smoker   Smokeless tobacco: Never Used  Building services engineer Use: Never used  Substance Use Topics   Alcohol use: Not Currently   Drug use: No    Home Medications Prior to Admission medications   Medication Sig Start Date End Date Taking? Authorizing Provider  doxepin (SINEQUAN) 10 MG capsule Take 1 capsule (10 mg total) by mouth 3 (three) times daily. 08/26/18   Dione Booze, MD  medroxyPROGESTERone (DEPO-PROVERA) 150 MG/ML injection Inject 150 mg into the muscle every 3 (three) months.    [provider]    Allergies    Banana  Review of Systems   Review of Systems  HENT: Positive for congestion.   All other systems reviewed and are negative.   Physical Exam Updated Vital Signs BP (!) 131/95 (BP Location: Left Arm)  Pulse 86    Temp 98.8 F (37.1 C) (Oral)    Resp 18    Ht 5\' 6"  (1.676 m)    Wt 85.7 kg    LMP 03/14/2020    SpO2 100%    BMI 30.51 kg/m   Physical Exam Vitals and nursing note reviewed.  Constitutional:      General: She is not in acute distress.    Appearance: She is well-developed. She is not ill-appearing.  HENT:     Head: Normocephalic and atraumatic.     Right Ear: Tympanic membrane and ear canal normal.     Left Ear: Tympanic membrane and ear canal normal.     Mouth/Throat:     Mouth: Mucous membranes are moist.     Pharynx: Oropharynx is clear.     Comments: Boggy turbinates, mildly erythematous Eyes:     Conjunctiva/sclera: Conjunctivae normal.  Cardiovascular:     Rate and Rhythm: Normal rate and regular rhythm.  Pulmonary:     Effort: Pulmonary effort is normal.     Breath sounds: Normal breath sounds.   Neurological:     Mental Status: She is alert.  Psychiatric:        Mood and Affect: Mood normal.        Behavior: Behavior normal.     ED Results / Procedures / Treatments   Labs (all labs ordered are listed, but only abnormal results are displayed) Labs Reviewed - No data to display  EKG None  Radiology No results found.  Procedures Procedures   Medications Ordered in ED Medications - No data to display  ED Course  I have reviewed the triage vital signs and the nursing notes.  Pertinent labs & imaging results that were available during my care of the patient were reviewed by me and considered in my medical decision making (see chart for details).    MDM Rules/Calculators/A&P                          Patient presenting for evaluation of clear mucus nasal congestion, sinus pressure, watery eyes since Tuesday last week.  Treated with allergy medication and Mucinex with minimal relief.  No systemic symptoms, evaluation is reassuring.  Presentation is not consistent with bacterial sinusitis.  Suggest allergic versus viral rhinosinusitis.  Recommend steroid nasal spray, oral hydration, Mucinex DM, outpatient follow-up.  Discharged in no distress  Discussed results, findings, treatment and follow up. Patient advised of return precautions. Patient verbalized understanding and agreed with plan.  Final Clinical Impression(s) / ED Diagnoses Final diagnoses:  Rhinosinusitis    Rx / DC Orders ED Discharge Orders    None       Ceaser Ebeling, Thursday N, PA-C 04/28/20 04/30/20, MD 04/28/20 2036

## 2020-07-05 ENCOUNTER — Encounter (HOSPITAL_COMMUNITY): Payer: Self-pay

## 2020-07-05 ENCOUNTER — Ambulatory Visit (HOSPITAL_COMMUNITY)
Admission: EM | Admit: 2020-07-05 | Discharge: 2020-07-05 | Disposition: A | Discharge status: Home / Self Care | Payer: Medicaid Other | Attending: Emergency Medicine | Admitting: Emergency Medicine

## 2020-07-05 ENCOUNTER — Other Ambulatory Visit: Payer: Self-pay

## 2020-07-05 DIAGNOSIS — L0501 Pilonidal cyst with abscess: Secondary | ICD-10-CM

## 2020-07-05 MED ORDER — IBUPROFEN 800 MG PO TABS: 800.0000 mg | ORAL_TABLET | Freq: Three times a day (TID) | ORAL | 0 refills | Status: DC

## 2020-07-05 MED ORDER — LIDOCAINE HCL 2 % IJ SOLN
INTRAMUSCULAR | Status: AC
  Filled 2020-07-05: qty 20

## 2020-07-05 MED ORDER — LIDOCAINE-EPINEPHRINE 1 %-1:100000 IJ SOLN
INTRAMUSCULAR | Status: AC
  Filled 2020-07-05: qty 1

## 2020-07-05 MED ORDER — CEPHALEXIN 500 MG PO CAPS: 500.0000 mg | ORAL_CAPSULE | Freq: Four times a day (QID) | ORAL | 0 refills | Status: AC

## 2020-07-05 NOTE — ED Triage Notes (Signed)
Pt reports having an abscess between buttocks x 1 week. Denies fever,drainage.

## 2020-07-05 NOTE — Discharge Instructions (Signed)
Keep dressing in place for the next 24 hours. May remove tomorrow.  Then cleanse area daily with soap and water.  Keep covered to keep protected and collect drainage.   Apply heat/ soak in warm soaks to promote additional drainage.  Complete course of antibiotics.  This may need more complete removal once infection has healed, follow up with generally surgery as needed.  Return for any worsening or further concerns.

## 2020-07-05 NOTE — ED Provider Notes (Signed)
[facility]    CSN: [id number] Arrival date & time: 07/05/20  1017      History   Chief Complaint Chief Complaint  Patient presents with  . Abscess    HPI Natasha Padilla is a 25 y.o. female.   Natasha Padilla presents with complaints of pilonidal abscess which is painful and is now causing fevers, which first started around two weeks ago. Worsening over the last week. No drainage. In the past with similar it did drain on it's own. No recent antibiotics. Pain and fevers have even led to occasional emesis.    ROS per HPI, negative if not otherwise mentioned.        Past Medical History:  Diagnosis Date  . Herpes   . HSV infection   . Medical history non-contributory     Patient Active Problem List   Diagnosis Date Noted  . 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 Adjustment disorder with mixed anxiety and depressed mood 04/17/2014  . Vaginal delivery 04/15/2014  . GERD (gastroesophageal reflux disease) 04/12/2014  . Hx of herpes genitalis 04/12/2014  . Pregnancy complicated by fetal GU abnormality   . Encounter for supervision of normal first pregnancy in third trimester 03/29/2014  . Fetal hydronephrosis in pregnancy, antepartum condition   . Low amniotic fluid   . Renal anomaly of fetus   . Pregnancy complicated by fetal GI abnormality     Past Surgical History:  Procedure Laterality Date  . laproscopic edometriosis surgery    . wisdom teeth removal  2010    OB History    Gravida  1   Para  1   Term  1   Preterm      AB      Living  1     SAB      IAB      Ectopic      Multiple  0   Live Births  1            Home Medications    Prior to Admission medications   Medication Sig Start Date End Date Taking? Authorizing Provider  cephALEXin (KEFLEX) 500 MG capsule Take 1 capsule (500 mg total) by mouth 4 (four) times daily for 7 days. 07/05/20 07/12/20 Yes Kjuan Seipp, Dorene Grebe B, NP  ibuprofen (ADVIL) 800 MG tablet Take 1  tablet (800 mg total) by mouth 3 (three) times daily. 07/05/20  Yes Ariston Grandison, Dorene Grebe B, NP  doxepin (SINEQUAN) 10 MG capsule Take 1 capsule (10 mg total) by mouth 3 (three) times daily. 08/26/18   Dione Booze, MD  medroxyPROGESTERone (DEPO-PROVERA) 150 MG/ML injection Inject 150 mg into the muscle every 3 (three) months.    [provider]    Family History Family History  Problem Relation Age of Onset  . Diabetes Father   . Diabetes Maternal Grandfather   . Hypertension Maternal Grandmother   . Diabetes Paternal Grandfather     Social History Social History   Tobacco Use  . Smoking status: Former Games developer  . Smokeless tobacco: Never Used  Vaping Use  . Vaping Use: Never used  Substance Use Topics  . Alcohol use: Not Currently  . Drug use: No     Allergies   Banana   Review of Systems Review of Systems   Physical Exam Triage Vital Signs ED Triage Vitals  Enc Vitals Group     BP 07/05/20 1046 105/68     Pulse Rate 07/05/20 1046 98  Resp 07/05/20 1046 18     Temp 07/05/20 1046 100.1 F (37.8 C)     Temp Source 07/05/20 1046 Oral     SpO2 --      Weight --      Height --      Head Circumference --      Peak Flow --      Pain Score 07/05/20 1043 9     Pain Loc --      Pain Edu? --      Excl. in GC? --    No data found.  Updated Vital Signs BP 105/68 (BP Location: Left Arm)   Pulse 98   Temp 100.1 F (37.8 C) (Oral)   Resp 18   LMP 06/30/2020 (Exact Date)   Breastfeeding No   Visual Acuity Right Eye Distance:   Left Eye Distance:   Bilateral Distance:    Right Eye Near:   Left Eye Near:    Bilateral Near:     Physical Exam Constitutional:      General: She is not in acute distress.    Appearance: She is well-developed.  Cardiovascular:     Rate and Rhythm: Normal rate.  Pulmonary:     Effort: Pulmonary effort is normal.  Skin:    General: Skin is warm and dry.          Comments: Left gluteal cleft with abscess and induration;  no active drainage   Neurological:     Mental Status: She is alert and oriented to person, place, and time.      UC Treatments / Results  Labs (all labs ordered are listed, but only abnormal results are displayed) Labs Reviewed - No data to display  EKG   Radiology No results found.  Procedures Incision and Drainage  Date/Time: 07/05/2020 11:47 AM Performed by: Georgetta Haber, NP Authorized by: Georgetta Haber, NP   Consent:    Consent obtained:  Verbal   Consent given by:  Patient   Risks, benefits, and alternatives were discussed: yes     Risks discussed:  Incomplete drainage, pain and bleeding   Alternatives discussed:  No treatment, observation and referral Universal protocol:    Procedure explained and questions answered to patient or proxy's satisfaction: yes     Patient identity confirmed:  Verbally with patient Location:    Type:  Abscess   Location:  Anogenital   Anogenital location:  Pilonidal Pre-procedure details:    Skin preparation:  Povidone-iodine Sedation:    Sedation type:  None Anesthesia:    Anesthesia method:  Local infiltration   Local anesthetic:  Lidocaine 2% w/o epi Procedure type:    Complexity:  Simple Procedure details:    Incision types:  Single straight   Drainage:  Purulent   Drainage amount:  Copious   Wound treatment:  Wound left open   Packing materials:  None Post-procedure details:    Procedure completion:  Tolerated well, no immediate complications   (including critical care time)  Medications Ordered in UC Medications - No data to display  Initial Impression / Assessment and Plan / UC Course  I have reviewed the triage vital signs and the nursing notes.  Pertinent labs & imaging results that were available during my care of the patient were reviewed by me and considered in my medical decision making (see chart for details).     Pilonidal abscess I&D with copious drainage. Tolerated well. After care and  expected course of healing discussed.  Follow up recommendations provided. Return precautions provided. Patient verbalized understanding and agreeable to plan.   Final Clinical Impressions(s) / UC Diagnoses   Final diagnoses:  Pilonidal abscess     Discharge Instructions     Keep dressing in place for the next 24 hours. May remove tomorrow.  Then cleanse area daily with soap and water.  Keep covered to keep protected and collect drainage.   Apply heat/ soak in warm soaks to promote additional drainage.  Complete course of antibiotics.  This may need more complete removal once infection has healed, follow up with generally surgery as needed.  Return for any worsening or further concerns.    ED Prescriptions    Medication Sig Dispense Auth. Provider   cephALEXin (KEFLEX) 500 MG capsule Take 1 capsule (500 mg total) by mouth 4 (four) times daily for 7 days. 28 capsule Linus Mako B, NP   ibuprofen (ADVIL) 800 MG tablet Take 1 tablet (800 mg total) by mouth 3 (three) times daily. 30 tablet Georgetta Haber, NP     PDMP not reviewed this encounter.   Georgetta Haber, NP 07/05/20 1159

## 2020-07-08 ENCOUNTER — Other Ambulatory Visit: Payer: Self-pay

## 2020-07-08 ENCOUNTER — Emergency Department (HOSPITAL_BASED_OUTPATIENT_CLINIC_OR_DEPARTMENT_OTHER)
Admission: EM | Admit: 2020-07-08 | Discharge: 2020-07-09 | Disposition: A | Discharge status: Home / Self Care | Payer: Medicaid Other | Attending: Emergency Medicine | Admitting: Emergency Medicine

## 2020-07-08 ENCOUNTER — Encounter (HOSPITAL_BASED_OUTPATIENT_CLINIC_OR_DEPARTMENT_OTHER): Payer: Self-pay | Admitting: Emergency Medicine

## 2020-07-08 DIAGNOSIS — U071 COVID-19: Secondary | ICD-10-CM | POA: Diagnosis not present

## 2020-07-08 DIAGNOSIS — Z87891 Personal history of nicotine dependence: Secondary | ICD-10-CM | POA: Insufficient documentation

## 2020-07-08 DIAGNOSIS — R1030 Lower abdominal pain, unspecified: Secondary | ICD-10-CM | POA: Diagnosis not present

## 2020-07-08 DIAGNOSIS — K219 Gastro-esophageal reflux disease without esophagitis: Secondary | ICD-10-CM | POA: Diagnosis not present

## 2020-07-08 DIAGNOSIS — R1010 Upper abdominal pain, unspecified: Secondary | ICD-10-CM | POA: Insufficient documentation

## 2020-07-08 DIAGNOSIS — Z21 Asymptomatic human immunodeficiency virus [HIV] infection status: Secondary | ICD-10-CM | POA: Insufficient documentation

## 2020-07-08 DIAGNOSIS — L299 Pruritus, unspecified: Secondary | ICD-10-CM | POA: Diagnosis not present

## 2020-07-08 DIAGNOSIS — R509 Fever, unspecified: Secondary | ICD-10-CM | POA: Diagnosis present

## 2020-07-08 MED ORDER — ONDANSETRON HCL 4 MG PO TABS
4.0000 mg | ORAL_TABLET | Freq: Once | ORAL | Status: DC
  Filled 2020-07-08: qty 1

## 2020-07-08 MED ORDER — ONDANSETRON 4 MG PO TBDP
ORAL_TABLET | ORAL | Status: AC
  Administered 2020-07-08: 4 mg via ORAL
  Filled 2020-07-08: qty 1

## 2020-07-08 MED ORDER — ACETAMINOPHEN 325 MG PO TABS
650.0000 mg | ORAL_TABLET | Freq: Once | ORAL | Status: AC
  Administered 2020-07-08: 650 mg via ORAL
  Filled 2020-07-08: qty 2

## 2020-07-08 MED ORDER — ONDANSETRON 4 MG PO TBDP: 4.0000 mg | ORAL_TABLET | Freq: Once | ORAL | Status: AC

## 2020-07-08 NOTE — ED Triage Notes (Signed)
Patient presents with complaints of fever, cold chills, abd pain, headache onset several days ago; states took ibuprofen this am but vomited afterwards. Seen 5/27 for abscess; states has not taken prescribed antibiotic.

## 2020-07-08 NOTE — ED Triage Notes (Signed)
Patient complains of chest pain and palpitations as well

## 2020-07-09 LAB — CBC WITH DIFFERENTIAL/PLATELET
Abs Immature Granulocytes: 0.02 10*3/uL (ref 0.00–0.07)
Basophils Absolute: 0 10*3/uL (ref 0.0–0.1)
Basophils Relative: 0 %
Eosinophils Absolute: 0 10*3/uL (ref 0.0–0.5)
Eosinophils Relative: 0 %
HCT: 37.3 % (ref 36.0–46.0)
Hemoglobin: 12.4 g/dL (ref 12.0–15.0)
Immature Granulocytes: 0 %
Lymphocytes Relative: 6 %
Lymphs Abs: 0.4 10*3/uL — ABNORMAL LOW (ref 0.7–4.0)
MCH: 28.7 pg (ref 26.0–34.0)
MCHC: 33.2 g/dL (ref 30.0–36.0)
MCV: 86.3 fL (ref 80.0–100.0)
Monocytes Absolute: 0.8 10*3/uL (ref 0.1–1.0)
Monocytes Relative: 10 %
Neutro Abs: 6.5 10*3/uL (ref 1.7–7.7)
Neutrophils Relative %: 84 %
Platelets: 303 10*3/uL (ref 150–400)
RBC: 4.32 MIL/uL (ref 3.87–5.11)
RDW: 12.3 % (ref 11.5–15.5)
WBC: 7.7 10*3/uL (ref 4.0–10.5)
nRBC: 0 % (ref 0.0–0.2)

## 2020-07-09 LAB — COMPREHENSIVE METABOLIC PANEL
ALT: 15 U/L (ref 0–44)
AST: 16 U/L (ref 15–41)
Albumin: 3.6 g/dL (ref 3.5–5.0)
Alkaline Phosphatase: 70 U/L (ref 38–126)
Anion gap: 9 (ref 5–15)
BUN: 7 mg/dL (ref 6–20)
CO2: 24 mmol/L (ref 22–32)
Calcium: 8.6 mg/dL — ABNORMAL LOW (ref 8.9–10.3)
Chloride: 103 mmol/L (ref 98–111)
Creatinine, Ser: 0.79 mg/dL (ref 0.44–1.00)
GFR, Estimated: >60 mL/min (ref >60)
Glucose, Bld: 116 mg/dL — ABNORMAL HIGH (ref 70–99)
Potassium: 3.7 mmol/L (ref 3.5–5.1)
Sodium: 136 mmol/L (ref 135–145)
Total Bilirubin: 0.6 mg/dL (ref 0.3–1.2)
Total Protein: 7.1 g/dL (ref 6.5–8.1)

## 2020-07-09 LAB — URINALYSIS, ROUTINE W REFLEX MICROSCOPIC
Bilirubin Urine: NEGATIVE
Glucose, UA: NEGATIVE mg/dL
Hgb urine dipstick: NEGATIVE
Ketones, ur: NEGATIVE mg/dL
Leukocytes,Ua: NEGATIVE
Nitrite: NEGATIVE
Protein, ur: NEGATIVE mg/dL
Specific Gravity, Urine: <1.005 — ABNORMAL LOW (ref 1.005–1.030)
pH: 7.5 (ref 5.0–8.0)

## 2020-07-09 LAB — LIPASE, BLOOD: Lipase: 23 U/L (ref 11–51)

## 2020-07-09 LAB — RESP PANEL BY RT-PCR (FLU A&B, COVID) ARPGX2
Influenza A by PCR: NEGATIVE
Influenza B by PCR: NEGATIVE
SARS Coronavirus 2 by RT PCR: POSITIVE — AB

## 2020-07-09 LAB — PREGNANCY, URINE: Preg Test, Ur: NEGATIVE

## 2020-07-09 MED ORDER — SODIUM CHLORIDE 0.9 % IV BOLUS
1000.0000 mL | Freq: Once | INTRAVENOUS | Status: AC
  Administered 2020-07-09: 1000 mL via INTRAVENOUS

## 2020-07-09 MED ORDER — OXYCODONE HCL 5 MG PO TABS
5.0000 mg | ORAL_TABLET | Freq: Once | ORAL | Status: AC
  Administered 2020-07-09: 5 mg via ORAL
  Filled 2020-07-09: qty 1

## 2020-07-09 MED ORDER — PROCHLORPERAZINE EDISYLATE 10 MG/2ML IJ SOLN
10.0000 mg | Freq: Once | INTRAMUSCULAR | Status: AC
  Administered 2020-07-09: 10 mg via INTRAVENOUS
  Filled 2020-07-09: qty 2

## 2020-07-09 MED ORDER — ONDANSETRON 4 MG PO TBDP: ORAL_TABLET | ORAL | 0 refills | Status: DC

## 2020-07-09 MED ORDER — LIDOCAINE-EPINEPHRINE (PF) 2 %-1:200000 IJ SOLN
10.0000 mL | Freq: Once | INTRAMUSCULAR | Status: DC
  Filled 2020-07-09: qty 20

## 2020-07-09 MED ORDER — DIPHENHYDRAMINE HCL 50 MG/ML IJ SOLN
25.0000 mg | Freq: Once | INTRAMUSCULAR | Status: AC
  Administered 2020-07-09: 25 mg via INTRAVENOUS
  Filled 2020-07-09: qty 1

## 2020-07-09 NOTE — ED Provider Notes (Signed)
[facility] EMERGENCY DEPARTMENT Provider Note   CSN: [id number] Arrival date & time: 07/08/20  2155     History Chief Complaint  Patient presents with  . multiple complaints    Natasha Padilla is a 25 y.o. female.  25 yo F with multiple complaints.  Complaining of headache abdominal pain fever.  Going on for about 4 to 5 days now.  Had been seen at an urgent care and had a abscess drained.  She thinks it is gotten quite a bit better since then.  Has had continued drainage feels like it is much less swollen but is itchy from time to time.  Complaining of pain to the upper and lower portion of the abdomen.  Diffusely across the head.  She denies any cough or congestion.  Denies nausea vomiting or diarrhea.  Denies vaginal bleeding or discharge.  Denies urinary symptoms.  The history is provided by the patient.  Illness Severity:  Moderate Onset quality:  Gradual Duration:  1 week Timing:  Constant Progression:  Worsening Chronicity:  New Associated symptoms: abdominal pain, fever, headaches and myalgias   Associated symptoms: no chest pain, no congestion, no nausea, no rhinorrhea, no shortness of breath, no vomiting and no wheezing        Past Medical History:  Diagnosis Date  . Herpes   . HSV infection   . Medical history non-contributory     Patient Active Problem List   Diagnosis Date Noted  . 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 Adjustment disorder with mixed anxiety and depressed mood 04/17/2014  . Vaginal delivery 04/15/2014  . GERD (gastroesophageal reflux disease) 04/12/2014  . Hx of herpes genitalis 04/12/2014  . Pregnancy complicated by fetal GU abnormality   . Encounter for supervision of normal first pregnancy in third trimester 03/29/2014  . Fetal hydronephrosis in pregnancy, antepartum condition   . Low amniotic fluid   . Renal anomaly of fetus   . Pregnancy complicated by fetal GI abnormality     Past Surgical History:  Procedure  Laterality Date  . laproscopic edometriosis surgery    . wisdom teeth removal  2010     OB History    Gravida  1   Para  1   Term  1   Preterm      AB      Living  1     SAB      IAB      Ectopic      Multiple  0   Live Births  1           Family History  Problem Relation Age of Onset  . Diabetes Father   . Diabetes Maternal Grandfather   . Hypertension Maternal Grandmother   . Diabetes Paternal Grandfather     Social History   Tobacco Use  . Smoking status: Former Games developermoker  . Smokeless tobacco: Never Used  Vaping Use  . Vaping Use: Never used  Substance Use Topics  . Alcohol use: Not Currently  . Drug use: No    Home Medications Prior to Admission medications   Medication Sig Start Date End Date Taking? Authorizing Provider  ondansetron (ZOFRAN ODT) 4 MG disintegrating tablet 4mg  ODT q4 hours prn nausea/vomit 07/09/20  Yes Melene PlanFloyd, Jearldine Cassady, DO  cephALEXin (KEFLEX) 500 MG capsule Take 1 capsule (500 mg total) by mouth 4 (four) times daily for 7 days. 07/05/20 07/12/20  Georgetta HaberBurky, Natalie B, NP  doxepin (SINEQUAN) 10 MG capsule Take 1 capsule (  10 mg total) by mouth 3 (three) times daily. 08/26/18   Dione Booze, MD  ibuprofen (ADVIL) 800 MG tablet Take 1 tablet (800 mg total) by mouth 3 (three) times daily. 07/05/20   Georgetta Haber, NP  medroxyPROGESTERone (DEPO-PROVERA) 150 MG/ML injection Inject 150 mg into the muscle every 3 (three) months.    [provider]    Allergies    Banana  Review of Systems   Review of Systems  Constitutional: Positive for fever. Negative for chills.  HENT: Negative for congestion and rhinorrhea.   Eyes: Negative for redness and visual disturbance.  Respiratory: Negative for shortness of breath and wheezing.   Cardiovascular: Negative for chest pain and palpitations.  Gastrointestinal: Positive for abdominal pain. Negative for nausea and vomiting.  Genitourinary: Negative for dysuria and urgency.  Musculoskeletal:  Positive for myalgias. Negative for arthralgias.  Skin: Negative for pallor and wound.  Neurological: Positive for headaches. Negative for dizziness.    Physical Exam Updated Vital Signs BP 110/66 (BP Location: Right Arm)   Pulse 98   Temp 99.2 F (37.3 C) (Oral)   Resp 16   Ht 5\' 6"  (1.676 m)   Wt 85.7 kg   LMP 06/30/2020 (Exact Date)   SpO2 94%   BMI 30.49 kg/m   Physical Exam Vitals and nursing note reviewed.  Constitutional:      General: She is not in acute distress.    Appearance: She is well-developed. She is not diaphoretic.  HENT:     Head: Normocephalic and atraumatic.  Eyes:     Pupils: Pupils are equal, round, and reactive to light.  Cardiovascular:     Rate and Rhythm: Normal rate and regular rhythm.     Heart sounds: No murmur heard. No friction rub. No gallop.   Pulmonary:     Effort: Pulmonary effort is normal.     Breath sounds: No wheezing or rales.  Abdominal:     General: There is no distension.     Palpations: Abdomen is soft.     Tenderness: There is abdominal tenderness.     Comments: Diffuse abdominal tenderness without focality.  Genitourinary:    Comments: Incision along the left pilonidal area.  Some mild fullness still to the area without obvious erythema no drainage. Musculoskeletal:        General: No tenderness.     Cervical back: Normal range of motion and neck supple.  Skin:    General: Skin is warm and dry.  Neurological:     Mental Status: She is alert and oriented to person, place, and time.  Psychiatric:        Behavior: Behavior normal.     ED Results / Procedures / Treatments   Labs (all labs ordered are listed, but only abnormal results are displayed) Labs Reviewed  RESP PANEL BY RT-PCR (FLU A&B, COVID) ARPGX2 - Abnormal; Notable for the following components:      Result Value   SARS Coronavirus 2 by RT PCR POSITIVE (*)    All other components within normal limits  URINALYSIS, ROUTINE W REFLEX MICROSCOPIC -  Abnormal; Notable for the following components:   Specific Gravity, Urine <1.005 (*)    All other components within normal limits  CBC WITH DIFFERENTIAL/PLATELET - Abnormal; Notable for the following components:   Lymphs Abs 0.4 (*)    All other components within normal limits  COMPREHENSIVE METABOLIC PANEL - Abnormal; Notable for the following components:   Glucose, Bld 116 (*)  Calcium 8.6 (*)    All other components within normal limits  WET PREP, GENITAL  PREGNANCY, URINE  LIPASE, BLOOD  GC/CHLAMYDIA PROBE AMP ([place]) NOT AT [facility]    EKG EKG Interpretation  Date/Time:  Monday Jul 08 2020 22:23:42 EDT Ventricular Rate:  106 PR Interval:  138 QRS Duration: 80 QT Interval:  324 QTC Calculation: 430 R Axis:   43 Text Interpretation: Sinus tachycardia Otherwise normal ECG No old tracing to compare Confirmed by Melene Plan [phone]) on 07/09/2020 1:37:27 AM   Radiology No results found.  Procedures Procedures   Medications Ordered in ED Medications  lidocaine-EPINEPHrine (XYLOCAINE W/EPI) 2 %-1:200000 (PF) injection 10 mL (has no administration in time range)  acetaminophen (TYLENOL) tablet 650 mg (650 mg Oral Given 07/08/20 2228)  ondansetron (ZOFRAN-ODT) disintegrating tablet 4 mg (4 mg Oral Given 07/08/20 2228)  sodium chloride 0.9 % bolus 1,000 mL (1,000 mLs Intravenous New Bag/Given 07/09/20 0057)  oxyCODONE (Oxy IR/ROXICODONE) immediate release tablet 5 mg (5 mg Oral Given 07/09/20 0059)  prochlorperazine (COMPAZINE) injection 10 mg (10 mg Intravenous Given 07/09/20 0059)  diphenhydrAMINE (BENADRYL) injection 25 mg (25 mg Intravenous Given 07/09/20 0059)    ED Course  I have reviewed the triage vital signs and the nursing notes.  Pertinent labs & imaging results that were available during my care of the patient were reviewed by me and considered in my medical decision making (see chart for details).    MDM Rules/Calculators/A&P                          25 yo F  with a chief complaints of fever headache abdominal pain going on for about a week now.  She is unsure why she feels so bad.  COVID test performed in triage came back positive.  Patient did have some lower abdominal pain and some mild fullness to the prior pilonidal abscess drainage area.  I offered to perform a pelvic exam and I&D however she is declining at this time.  Have her treat supportively.  2:25 AM:  I have discussed the diagnosis/risks/treatment options with the patient and believe the pt to be eligible for discharge home to follow-up with PCP. We also discussed returning to the ED immediately if new or worsening sx occur. We discussed the sx which are most concerning (e.g., sudden worsening pain, fever, inability to tolerate by mouth) that necessitate immediate return. Medications administered to the patient during their visit and any new prescriptions provided to the patient are listed below.  Medications given during this visit Medications  lidocaine-EPINEPHrine (XYLOCAINE W/EPI) 2 %-1:200000 (PF) injection 10 mL (has no administration in time range)  acetaminophen (TYLENOL) tablet 650 mg (650 mg Oral Given 07/08/20 2228)  ondansetron (ZOFRAN-ODT) disintegrating tablet 4 mg (4 mg Oral Given 07/08/20 2228)  sodium chloride 0.9 % bolus 1,000 mL (1,000 mLs Intravenous New Bag/Given 07/09/20 0057)  oxyCODONE (Oxy IR/ROXICODONE) immediate release tablet 5 mg (5 mg Oral Given 07/09/20 0059)  prochlorperazine (COMPAZINE) injection 10 mg (10 mg Intravenous Given 07/09/20 0059)  diphenhydrAMINE (BENADRYL) injection 25 mg (25 mg Intravenous Given 07/09/20 0059)     The patient appears reasonably screen and/or stabilized for discharge and I doubt any other medical condition or other [facility] requiring further screening, evaluation, or treatment in the ED at this time prior to discharge.   Final Clinical Impression(s) / ED Diagnoses Final diagnoses:  COVID-19 virus infection    Rx / DC Orders  ED  Discharge Orders         Ordered    ondansetron (ZOFRAN ODT) 4 MG disintegrating tablet        07/09/20 0158           Melene Plan, DO 07/09/20 0225

## 2020-07-09 NOTE — Discharge Instructions (Signed)
Take tylenol 2 pills 4 times a day and motrin 4 pills 3 times a day.  Drink plenty of fluids.  Return for worsening shortness of breath, headache, confusion. Follow up with your family doctor.   

## 2020-07-09 NOTE — ED Notes (Signed)
In with Dr. Adela Lank to assess the Pt. Complaint in the buttock region.

## 2020-09-28 ENCOUNTER — Encounter (HOSPITAL_COMMUNITY): Payer: Self-pay

## 2020-09-28 ENCOUNTER — Emergency Department (HOSPITAL_COMMUNITY)
Admission: EM | Admit: 2020-09-28 | Discharge: 2020-09-28 | Disposition: A | Discharge status: Home / Self Care | Payer: Medicaid Other | Attending: Emergency Medicine | Admitting: Emergency Medicine

## 2020-09-28 ENCOUNTER — Other Ambulatory Visit: Payer: Self-pay

## 2020-09-28 DIAGNOSIS — L0231 Cutaneous abscess of buttock: Secondary | ICD-10-CM | POA: Insufficient documentation

## 2020-09-28 DIAGNOSIS — Z87891 Personal history of nicotine dependence: Secondary | ICD-10-CM | POA: Diagnosis not present

## 2020-09-28 DIAGNOSIS — Z23 Encounter for immunization: Secondary | ICD-10-CM | POA: Diagnosis not present

## 2020-09-28 LAB — PREGNANCY, URINE: Preg Test, Ur: NEGATIVE

## 2020-09-28 MED ORDER — TETANUS-DIPHTH-ACELL PERTUSSIS 5-2.5-18.5 LF-MCG/0.5 IM SUSY
0.5000 mL | PREFILLED_SYRINGE | Freq: Once | INTRAMUSCULAR | Status: AC
  Administered 2020-09-28: 0.5 mL via INTRAMUSCULAR
  Filled 2020-09-28: qty 0.5

## 2020-09-28 MED ORDER — IBUPROFEN 800 MG PO TABS: 800.0000 mg | ORAL_TABLET | Freq: Three times a day (TID) | ORAL | 0 refills | Status: AC

## 2020-09-28 MED ORDER — SULFAMETHOXAZOLE-TRIMETHOPRIM 800-160 MG PO TABS: 1.0000 | ORAL_TABLET | Freq: Two times a day (BID) | ORAL | 0 refills | Status: AC

## 2020-09-28 MED ORDER — LIDOCAINE-EPINEPHRINE 2 %-1:100000 IJ SOLN
20.0000 mL | Freq: Once | INTRAMUSCULAR | Status: AC
  Administered 2020-09-28: 20 mL
  Filled 2020-09-28: qty 1

## 2020-09-28 MED ORDER — LIDOCAINE-EPINEPHRINE-TETRACAINE (LET) TOPICAL GEL
3.0000 mL | Freq: Once | TOPICAL | Status: AC
  Administered 2020-09-28: 3 mL via TOPICAL
  Filled 2020-09-28: qty 3

## 2020-09-28 NOTE — ED Triage Notes (Signed)
Pt states abscess on buttocks x4 days. States hx of same, increasing in size.  States she normally needs it lanced.

## 2020-09-28 NOTE — Discharge Instructions (Addendum)
You have a skin abscess that was incised and drained here.  Please continue to apply warm moist compress to affected area several times daily to help aid with healing.  Packing can be removed in 48 hours.  Take antibiotic as prescribed.  Take ibuprofen as needed for pain.  Return if you have any concern.

## 2020-09-28 NOTE — ED Provider Notes (Signed)
[facility]-EMERGENCY DEPT Provider Note   CSN: [id number] Arrival date & time: 09/28/20  1154     History Chief Complaint  Patient presents with   Abscess    Natasha Padilla is a 25 y.o. female.   Abscess Associated symptoms: no fever    25 year old female with previous history of skin abscess presenting with concerns of an abscess to her buttock.  Patient report gradual onset of pain and swelling to her right buttock that felt similar to prior abscess that required incision and drainage.  Pain is getting progressively more intense, throbbing, moderate in intensity worse with sitting with palpation.  Pain alleviated when she sits on the commode.  She did try using a warm moist compress and sitz bath with minimal relief.  She has noticed increasing swelling to the affected area.  She is unsure her last tetanus status.  She is currently not pregnant.  She denies any difficulty having bowel movement and denies any rectal pain.  No fever or chills.  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   Adjustment disorder with mixed anxiety and depressed mood 04/17/2014   Vaginal delivery 04/15/2014   GERD (gastroesophageal reflux disease) 04/12/2014   Hx of herpes genitalis 04/12/2014   Pregnancy complicated by fetal GU abnormality    Encounter for supervision of normal first pregnancy in third trimester 03/29/2014   Fetal hydronephrosis in pregnancy, antepartum condition    Low amniotic fluid    Renal anomaly of fetus    Pregnancy complicated by fetal GI abnormality     Past Surgical History:  Procedure Laterality Date   laproscopic edometriosis surgery     wisdom teeth removal  2010     OB History     Gravida  1   Para  1   Term  1   Preterm      AB      Living  1      SAB      IAB       Ectopic      Multiple  0   Live Births  1           Family History  Problem Relation Age of Onset   Diabetes Father    Diabetes Maternal Grandfather    Hypertension Maternal Grandmother    Diabetes Paternal Grandfather     Social History   Tobacco Use   Smoking status: Former   Smokeless tobacco: Never  Building services engineer Use: Never used  Substance Use Topics   Alcohol use: Not Currently   Drug use: No    Home Medications Prior to Admission medications   Medication Sig Start Date End Date Taking? Authorizing Provider  doxepin (SINEQUAN) 10 MG capsule Take 1 capsule (10 mg total) by mouth 3 (three) times daily. 08/26/18   Dione Booze, MD  ibuprofen (ADVIL) 800 MG tablet Take 1 tablet (800 mg total) by mouth 3 (three) times daily. 07/05/20   Georgetta Haber, NP  medroxyPROGESTERone (DEPO-PROVERA) 150 MG/ML injection Inject 150 mg into the muscle every 3 (three) months.    [provider]  ondansetron (ZOFRAN ODT) 4 MG disintegrating tablet 4mg  ODT q4 hours prn nausea/vomit 07/09/20   07/11/20, DO    Allergies    Banana  Review of Systems   Review of Systems  Constitutional:  Negative for fever.  Gastrointestinal:  Negative for rectal pain.   Physical Exam Updated Vital Signs BP 130/86 (BP Location: Left Arm)   Pulse 100   Temp 98.4 F (36.9 C) (Oral)   Resp 16   SpO2 97%   Physical Exam Vitals and nursing note reviewed.  Constitutional:      General: She is not in acute distress.    Appearance: She is well-developed.  HENT:     Head: Atraumatic.  Eyes:     Conjunctiva/sclera: Conjunctivae normal.  Pulmonary:     Effort: Pulmonary effort is normal.  Abdominal:     Palpations: Abdomen is soft.     Tenderness: There is no abdominal tenderness.  Genitourinary:    Comments: Chaperone present during exam.  There is an area of induration with surrounding erythema along with fluctuant noted to the gluteal cleft favoring the right buttock.   Area measuring approximately 5 cm in diameter Musculoskeletal:     Cervical back: Neck supple.  Skin:    Findings: No rash.  Neurological:     Mental Status: She is alert.  Psychiatric:        Mood and Affect: Mood normal.    ED Results / Procedures / Treatments   Labs (all labs ordered are listed, but only abnormal results are displayed) Labs Reviewed  PREGNANCY, URINE    EKG None  Radiology No results found.  Procedures .Marland KitchenIncision and Drainage  Date/Time: 09/28/2020 5:39 PM Performed by: Fayrene Helper, PA-C Authorized by: Fayrene Helper, PA-C   Consent:    Consent obtained:  Verbal   Consent given by:  Patient   Risks discussed:  Bleeding, incomplete drainage, pain and damage to other organs   Alternatives discussed:  No treatment Universal protocol:    Procedure explained and questions answered to patient or proxy's satisfaction: yes     Relevant documents present and verified: yes     Test results available : yes     Imaging studies available: yes     Required blood products, implants, devices, and special equipment available: yes     Site/side marked: yes     Immediately prior to procedure, a time out was called: yes     Patient identity confirmed:  Verbally with patient Location:    Type:  Abscess   Size:  5cm   Location:  Anogenital   Anogenital location:  Gluteal cleft Pre-procedure details:    Skin preparation:  Betadine Anesthesia:    Anesthesia method:  Local infiltration   Local anesthetic:  Lidocaine 1% WITH epi Procedure type:    Complexity:  Complex Procedure details:    Incision types:  Single straight   Incision depth:  Subcutaneous   Wound management:  Probed and deloculated, irrigated with saline and extensive cleaning   Drainage:  Purulent   Drainage amount:  Moderate   Packing materials:  1/4 in gauze Post-procedure details:    Procedure completion:  Tolerated well, no immediate complications   Medications Ordered in ED Medications   Tdap (BOOSTRIX) injection 0.5 mL (0.5 mLs Intramuscular Given 09/28/20 1625)  lidocaine-EPINEPHrine (XYLOCAINE W/EPI) 2 %-1:100000 (with pres) injection 20 mL (20 mLs Infiltration Given 09/28/20 1625)  lidocaine-EPINEPHrine-tetracaine (LET) topical gel (3 mLs Topical Given 09/28/20 1651)    ED Course  I have reviewed the triage vital signs and the nursing notes.  Pertinent labs & imaging results that were available during my care of the  patient were reviewed by me and considered in my medical decision making (see chart for details).    MDM Rules/Calculators/A&P                           BP 130/86 (BP Location: Left Arm)   Pulse 100   Temp 98.4 F (36.9 C) (Oral)   Resp 16   SpO2 97%   Final Clinical Impression(s) / ED Diagnoses Final diagnoses:  Abscess of buttock, right    Rx / DC Orders ED Discharge Orders     None      Patient have a subcutaneous abscess involving her gluteal cleft that favors more towards her right buttock.  Successful incision and drainage with marked purulent discharge.  Packing was placed.  Will discharge home with antibiotic, appropriate abscess management and return precaution given.   Fayrene Helper, PA-C 09/28/20 1818    Mancel Bale, MD 09/28/20 [phone]

## 2021-04-24 ENCOUNTER — Other Ambulatory Visit: Payer: Self-pay

## 2021-04-24 ENCOUNTER — Emergency Department (HOSPITAL_BASED_OUTPATIENT_CLINIC_OR_DEPARTMENT_OTHER)
Admission: EM | Admit: 2021-04-24 | Discharge: 2021-04-24 | Disposition: A | Discharge status: Home / Self Care | Payer: Medicaid Other | Attending: Emergency Medicine | Admitting: Emergency Medicine

## 2021-04-24 ENCOUNTER — Encounter (HOSPITAL_BASED_OUTPATIENT_CLINIC_OR_DEPARTMENT_OTHER): Payer: Self-pay | Admitting: *Deleted

## 2021-04-24 DIAGNOSIS — Z5321 Procedure and treatment not carried out due to patient leaving prior to being seen by health care provider: Secondary | ICD-10-CM | POA: Insufficient documentation

## 2021-04-24 DIAGNOSIS — L509 Urticaria, unspecified: Secondary | ICD-10-CM | POA: Insufficient documentation

## 2021-04-24 DIAGNOSIS — F410 Panic disorder [episodic paroxysmal anxiety] without agoraphobia: Secondary | ICD-10-CM | POA: Insufficient documentation

## 2021-04-24 HISTORY — DX: Anxiety disorder, unspecified: F41.9

## 2021-04-24 MED ORDER — DIPHENHYDRAMINE HCL 12.5 MG/5ML PO ELIX
25.0000 mg | ORAL_SOLUTION | Freq: Once | ORAL | Status: AC
  Administered 2021-04-24: 25 mg via ORAL
  Filled 2021-04-24: qty 10

## 2021-04-24 NOTE — ED Triage Notes (Signed)
States for 3 days she has had panic attacks and hives.  ?

## 2022-02-15 ENCOUNTER — Other Ambulatory Visit: Payer: Self-pay

## 2022-02-15 ENCOUNTER — Encounter (HOSPITAL_BASED_OUTPATIENT_CLINIC_OR_DEPARTMENT_OTHER): Payer: Self-pay

## 2022-02-15 ENCOUNTER — Emergency Department (HOSPITAL_BASED_OUTPATIENT_CLINIC_OR_DEPARTMENT_OTHER)
Admission: EM | Admit: 2022-02-15 | Discharge: 2022-02-15 | Disposition: A | Discharge status: Home / Self Care | Payer: Medicaid Other | Attending: Emergency Medicine | Admitting: Emergency Medicine

## 2022-02-15 DIAGNOSIS — Z4801 Encounter for change or removal of surgical wound dressing: Secondary | ICD-10-CM | POA: Insufficient documentation

## 2022-02-15 DIAGNOSIS — Z5189 Encounter for other specified aftercare: Secondary | ICD-10-CM

## 2022-02-15 NOTE — ED Provider Notes (Signed)
  [facility] EMERGENCY DEPARTMENT Provider Note   CSN: [id number] Arrival date & time: 02/15/22  1929     History {Add pertinent medical, surgical, social history, OB history to HPI:1} Chief Complaint  Patient presents with   Post-op Problem    [place] Suann Klier is a 27 y.o. female.  HPI     Home Medications Prior to Admission medications   Medication Sig Start Date End Date Taking? Authorizing Provider  doxepin (SINEQUAN) 10 MG capsule Take 1 capsule (10 mg total) by mouth 3 (three) times daily. 6/56/81   Delora Fuel, MD  FLUoxetine [facility]) 20 MG/5ML solution Take by mouth daily.    [provider]  ibuprofen (ADVIL) 800 MG tablet Take 1 tablet (800 mg total) by mouth 3 (three) times daily. 09/28/20   Domenic Moras, PA-C  medroxyPROGESTERone (DEPO-PROVERA) 150 MG/ML injection Inject 150 mg into the muscle every 3 (three) months.    [provider]  ondansetron (ZOFRAN ODT) 4 MG disintegrating tablet 4mg  ODT q4 hours prn nausea/vomit 07/09/20   Deno Etienne, DO      Allergies    Banana    Review of Systems   Review of Systems  Physical Exam Updated Vital Signs BP 124/64   Pulse 82   Temp 98.1 F (36.7 C) (Oral)   Resp 14   Ht 5\' 5"  (1.651 m)   Wt 98 kg   LMP 02/13/2022 (Approximate)   SpO2 99%   BMI 35.94 kg/m  Physical Exam  ED Results / Procedures / Treatments   Labs (all labs ordered are listed, but only abnormal results are displayed) Labs Reviewed - No data to display  EKG None  Radiology No results found.  Procedures Procedures  {Document cardiac monitor, telemetry assessment procedure when appropriate:1}  Medications Ordered in ED Medications - No data to display  ED Course/ Medical Decision Making/ A&P                           Medical Decision Making  ***  {Document critical care time when appropriate:1} {Document review of labs and clinical decision tools ie heart score, Chads2Vasc2 etc:1}  {Document your  independent review of radiology images, and any outside records:1} {Document your discussion with family members, caretakers, and with consultants:1} {Document social determinants of health affecting pt's care:1} {Document your decision making why or why not admission, treatments were needed:1} Final Clinical Impression(s) / ED Diagnoses Final diagnoses:  None    Rx / DC Orders ED Discharge Orders     None

## 2022-02-15 NOTE — ED Notes (Signed)
ED Provider at bedside. 

## 2022-02-15 NOTE — ED Notes (Signed)
Pt stated that she does not need AVS prior to d/c. Pt informed to f/u with surgeon tomorrow. Pt verbalized understanding, no questions or concerns at time of d/c. Ambulatory to ED exit without assistance, NAD.

## 2022-02-15 NOTE — ED Triage Notes (Signed)
Pt is s/p excision of a complex pilonidal cyst and rotation flap closure on 12/29. Tonight she noticed the incision is leaking and is burning. Denies fevers/chills.

## 2022-02-15 NOTE — ED Notes (Signed)
Quick clot gauze applied to pts incision site, per EDP Trifan's request.

## 2022-02-15 NOTE — Discharge Instructions (Signed)
Please contact the office of your surgeon tomorrow morning to discuss the drainage you are having near your wound site.  It appears that a small part of the surgical wound, about 1/2 inch, has come apart, leading to some drainage.  This should be seen by the surgeon in the office early this week.

## 2023-10-21 ENCOUNTER — Emergency Department (HOSPITAL_BASED_OUTPATIENT_CLINIC_OR_DEPARTMENT_OTHER)
Admission: EM | Admit: 2023-10-21 | Discharge: 2023-10-21 | Disposition: A | Discharge status: Home / Self Care | Attending: Emergency Medicine | Admitting: Emergency Medicine

## 2023-10-21 ENCOUNTER — Encounter (HOSPITAL_BASED_OUTPATIENT_CLINIC_OR_DEPARTMENT_OTHER): Payer: Self-pay

## 2023-10-21 ENCOUNTER — Other Ambulatory Visit: Payer: Self-pay

## 2023-10-21 DIAGNOSIS — A6004 Herpesviral vulvovaginitis: Secondary | ICD-10-CM | POA: Diagnosis not present

## 2023-10-21 DIAGNOSIS — J0100 Acute maxillary sinusitis, unspecified: Secondary | ICD-10-CM | POA: Insufficient documentation

## 2023-10-21 DIAGNOSIS — R059 Cough, unspecified: Secondary | ICD-10-CM | POA: Diagnosis present

## 2023-10-21 MED ORDER — AMOXICILLIN-POT CLAVULANATE 875-125 MG PO TABS: 1.0000 | ORAL_TABLET | Freq: Two times a day (BID) | ORAL | 0 refills | Status: DC

## 2023-10-21 MED ORDER — ONDANSETRON 4 MG PO TBDP: ORAL_TABLET | ORAL | 0 refills | Status: AC

## 2023-10-21 MED ORDER — BENZONATATE 100 MG PO CAPS: 100.0000 mg | ORAL_CAPSULE | Freq: Three times a day (TID) | ORAL | 0 refills | Status: AC

## 2023-10-21 MED ORDER — VALACYCLOVIR HCL 1 G PO TABS: 1000.0000 mg | ORAL_TABLET | Freq: Three times a day (TID) | ORAL | 0 refills | Status: AC

## 2023-10-21 NOTE — Discharge Instructions (Signed)
 Take tylenol 2 pills 4 times a day and motrin 4 pills 3 times a day.  Drink plenty of fluids.  Return for worsening shortness of breath, headache, confusion. Follow up with your family doctor.

## 2023-10-21 NOTE — ED Triage Notes (Signed)
 Pt states that she is having an outbreak. 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 Some productive coughing when mucous builds up. No fevers, also states burning with urination.

## 2023-10-21 NOTE — ED Notes (Signed)
 In triage, pt asked suicide screening question, In the past month have you wanted to harm yourself or anyone else. Pt stated, I decline to answer that question. Pt informed that's not an option. Then stated, I'm not suicidal.

## 2023-10-21 NOTE — ED Provider Notes (Signed)
 [facility] Provider Note   CSN: [id number] Arrival date & time: 10/21/23  9164     Patient presents with: Nasal Congestion   [name] is a 28 y.o. female.   28 yo F with a cc of cough congestion going on for about 4 days now.  No known sick contacts.  No difficulty breathing no nausea vomiting or diarrhea no abdominal pain.  She also is having a burning about her vagina that makes her feel like she is having a herpes outbreak.  Has had this previously.  She tried to go to her PCP yesterday but due to long waits had to leave.        Prior to Admission medications   Medication Sig Start Date End Date Taking? Authorizing Provider  amoxicillin -clavulanate (AUGMENTIN ) 875-125 MG tablet Take 1 tablet by mouth every 12 (twelve) hours. 10/21/23  Yes Emil Share, DO  benzonatate  (TESSALON ) 100 MG capsule Take 1 capsule (100 mg total) by mouth every 8 (eight) hours. 10/21/23  Yes Emil Share, DO  ondansetron  (ZOFRAN -ODT) 4 MG disintegrating tablet 4mg  ODT q4 hours prn nausea/vomit 10/21/23  Yes Besnik Febus, DO  valACYclovir  (VALTREX ) 1000 MG tablet Take 1 tablet (1,000 mg total) by mouth 3 (three) times daily. 10/21/23  Yes Emil Share, DO  doxepin  (SINEQUAN ) 10 MG capsule Take 1 capsule (10 mg total) by mouth 3 (three) times daily. 08/26/18   Raford Lenis, MD  FLUoxetine (PROZAC) 20 MG/5ML solution Take by mouth daily.    [provider]  ibuprofen  (ADVIL ) 800 MG tablet Take 1 tablet (800 mg total) by mouth 3 (three) times daily. 09/28/20   Nivia Colon, PA-C  medroxyPROGESTERone (DEPO-PROVERA) 150 MG/ML injection Inject 150 mg into the muscle every 3 (three) months.    [provider]    Allergies: Banana    Review of Systems  Updated Vital Signs BP 123/75 (BP Location: Right Arm)   Pulse 97   Temp 98.4 F (36.9 C) (Oral)   Resp 16   Ht 5' 5 (1.651 m)   Wt 96.6 kg   SpO2 97%   BMI 35.45 kg/m   Physical  Exam Vitals and nursing note reviewed.  Constitutional:      General: She is not in acute distress.    Appearance: She is well-developed. She is not diaphoretic.  HENT:     Head: Normocephalic and atraumatic.     Comments: Swollen turbinates, posterior nasal drip, Pain about the L maxillary area to percussion, tm normal bilaterally.   Eyes:     Pupils: Pupils are equal, round, and reactive to light.  Cardiovascular:     Rate and Rhythm: Normal rate and regular rhythm.     Heart sounds: No murmur heard.    No friction rub. No gallop.  Pulmonary:     Effort: Pulmonary effort is normal.     Breath sounds: No wheezing or rales.  Abdominal:     General: There is no distension.     Palpations: Abdomen is soft.     Tenderness: There is no abdominal tenderness.  Musculoskeletal:        General: No tenderness.     Cervical back: Normal range of motion and neck supple.  Skin:    General: Skin is warm and dry.  Neurological:     Mental Status: She is alert and oriented to person, place, and time.  Psychiatric:        Behavior: Behavior  normal.     (all labs ordered are listed, but only abnormal results are displayed) Labs Reviewed - No data to display  EKG: None  Radiology: No results found.   Procedures   Medications Ordered in the ED - No data to display                                  Medical Decision Making Risk Prescription drug management.   28 yo F with 2 separate complaints.  1 she is worried that she is coming down with herpes, also upper respiratory illness cough congestion going on for a few days.  She is well-appearing nontoxic.  Clear lung sounds for me.  She does have some signs concerning for acute sinusitis.  With exquisite pain we will treat with antibiotics per guidelines.  Will have her follow-up with her PCP in the office.  9:01 AM:  I have discussed the diagnosis/risks/treatment options with the patient.  Evaluation and diagnostic testing in the  emergency department does not suggest an emergent condition requiring admission or immediate intervention beyond what has been performed at this time.  They will follow up with PCP. We also discussed returning to the ED immediately if new or worsening sx occur. We discussed the sx which are most concerning (e.g., sudden worsening pain, fever, inability to tolerate by mouth) that necessitate immediate return. Medications administered to the patient during their visit and any new prescriptions provided to the patient are listed below.  Medications given during this visit Medications - No data to display   The patient appears reasonably screen and/or stabilized for discharge and I doubt any other medical condition or other [facility] requiring further screening, evaluation, or treatment in the ED at this time prior to discharge.       Final diagnoses:  Acute maxillary sinusitis, recurrence not specified  Herpes simplex vulvovaginitis    ED Discharge Orders          Ordered    valACYclovir  (VALTREX ) 1000 MG tablet  3 times daily        10/21/23 0859    amoxicillin -clavulanate (AUGMENTIN ) 875-125 MG tablet  Every 12 hours        10/21/23 0859    ondansetron  (ZOFRAN -ODT) 4 MG disintegrating tablet        10/21/23 0859    benzonatate  (TESSALON ) 100 MG capsule  Every 8 hours        10/21/23 0859               Emil Share, DO 10/21/23 0901

## 2023-11-26 ENCOUNTER — Other Ambulatory Visit (HOSPITAL_COMMUNITY): Payer: Self-pay

## 2023-11-26 ENCOUNTER — Encounter: Payer: Self-pay | Admitting: Internal Medicine

## 2023-11-26 ENCOUNTER — Ambulatory Visit (INDEPENDENT_AMBULATORY_CARE_PROVIDER_SITE_OTHER): Admitting: Internal Medicine

## 2023-11-26 ENCOUNTER — Other Ambulatory Visit: Payer: Self-pay

## 2023-11-26 ENCOUNTER — Telehealth: Payer: Self-pay

## 2023-11-26 VITALS — BP 118/72 | HR 91 | Temp 98.3°F | Ht 65.5 in | Wt 213.7 lb

## 2023-11-26 DIAGNOSIS — L508 Other urticaria: Secondary | ICD-10-CM | POA: Diagnosis not present

## 2023-11-26 DIAGNOSIS — H1045 Other chronic allergic conjunctivitis: Secondary | ICD-10-CM

## 2023-11-26 DIAGNOSIS — R0609 Other forms of dyspnea: Secondary | ICD-10-CM | POA: Diagnosis not present

## 2023-11-26 DIAGNOSIS — T7819XD Other adverse food reactions, not elsewhere classified, subsequent encounter: Secondary | ICD-10-CM

## 2023-11-26 DIAGNOSIS — J3089 Other allergic rhinitis: Secondary | ICD-10-CM | POA: Diagnosis not present

## 2023-11-26 DIAGNOSIS — T7819XA Other adverse food reactions, not elsewhere classified, initial encounter: Secondary | ICD-10-CM

## 2023-11-26 MED ORDER — CETIRIZINE HCL 10 MG PO TABS: 10.0000 mg | ORAL_TABLET | Freq: Every day | ORAL | 2 refills | Status: AC

## 2023-11-26 MED ORDER — FLUTICASONE PROPIONATE 50 MCG/ACT NA SUSP: 1.0000 | Freq: Every day | NASAL | 5 refills | Status: AC

## 2023-11-26 MED ORDER — BUDESONIDE-FORMOTEROL FUMARATE 80-4.5 MCG/ACT IN AERO: 2.0000 | INHALATION_SPRAY | Freq: Two times a day (BID) | RESPIRATORY_TRACT | 12 refills | Status: DC

## 2023-11-26 NOTE — Telephone Encounter (Signed)
*  AA  Pharmacy Patient Advocate Encounter   Received notification from CoverMyMeds that prior authorization for Breyna 80-4.5MCG/ACT aerosol  is required/requested.   Insurance verification completed.   The patient is insured through [facility] MEDICAID.   Per test claim:  Brand Symbicort is preferred by the insurance.  If suggested medication is appropriate, Please send in a new RX and discontinue this one. If not, please advise as to why it's not appropriate so that we may request a Prior Authorization. Please note, some preferred medications may still require a PA.  If the suggested medications have not been trialed and there are no contraindications to their use, the PA will not be submitted, as it will not be approved.   Brand Symbicort-$4.00

## 2023-11-26 NOTE — Patient Instructions (Signed)
 Allergic rhinitis due to pollen with recurrent acute sinusitis Recurrent sinusitis exacerbated by nasal trauma. No regular antihistamine use. - Continue sinus rinses daily. Do 10-15 minutes prior to nose sprays  - Start Flonase nasal spray, one spray per nostril twice daily. 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 - Advise strict avoidance of milk in all forms except baked milk. - Schedule allergy testing including milk. - Will get blood work today for milk  - Will hold off on epipen  prescription until after skin testing   Pineapple oral allergy syndrome Itchy mouth after pineapple consumption. - Advise avoidance of pineapple in all forms. - Will have her return for allergy testing for pineapple  - Will get blood work today for pineapple   Exertional dyspnea and chest pain  Intermittent exertional dyspnea and chest pain, possible cardiac causes considered.  - Breathing test today showed: was normal at baseline and no response to albuterol  - Recommend cardiac evaluation given atypical symptoms and lack of response to albuterol  - Will do trial of ICS/LABA to assess response, but I think it is less likely her symptoms are due to uncontrolled asthma   - Start Symbicort 80mcg 2 puffs twice daily with spacer.    -rinse mouth after use    Follow up: Monday at 9:30 for allergy testing (1-55, milk, pineapple)   -hold all antihistamines including hydroxyzine and zyrtec

## 2023-11-26 NOTE — Progress Notes (Signed)
 NEW PATIENT Date of Service/Encounter:  11/26/23 Referring provider: Theotis Gains, NP Primary care provider: Theotis Gains, NP  Subjective:  Natasha Padilla Renee Storti is a 28 y.o. female  presenting today for evaluation of asthma and rhinitis  History obtained from: chart review and patient.   Discussed the use of AI scribe software for clinical note transcription with the patient, who gave verbal consent to proceed.  History of Present Illness Natasha Padilla Natasha Padilla is a 28 year old female with asthma and allergies who presents for establishment of care. She is accompanied by her husband, Maude. She was referred by her primary care provider for evaluation of asthma and allergies.  Exertional dyspnea and chest pain - Shortness of breath and chest pain with exertion, such as walking or climbing stairs - Symptoms present for approximately five months, beginning in spring - Shortness of breath has worsened with weight gain over the past few years - Uses albuterol inhaler as needed, typically once daily during exertion - Albuterol provides only partial relief - Does not carry inhaler on her person, keeps it in her car - no cough, wheeze associated  - chest pain is relieved with rest most of the time  - no cardiac evaluation has been done   Allergic reactions to foods - Hives and lip swelling after consuming dairy products, including milk, whipped cream, and sour cream - symptoms last for a week  - no prior allergy testing or evaluation  - Attempts to avoid dairy, but has had accidental exposures resulting in swollen lips and hives - Itchy mouth after consuming pineapple or pineapple juice - No issues with other foods, drugs or animals  Allergic rhinitis and seasonal allergies - Seasonal symptoms in spring, including runny and stuffy nose, itchy and watery eyes, and hives - History of sinus infections attributed to allergies, typically resolving in less than ten days without  antibiotics  Sinonasal symptoms and olfactory dysfunction - 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   Past Medical History: Past Medical History:  Diagnosis Date   Anxiety    Herpes    HSV infection    Medical history non-contributory    Medication List:  Current Outpatient Medications  Medication Sig Dispense Refill   ABILIFY MAINTENA 400 MG PRSY prefilled syringe SMARTSIG:1 Each IM Every 4 Weeks     Aripiprazole 1 MG/ML mL TAKE 5 ML BY MOUTH AT BEDTIME FOR MOOD STABILIZATION     atomoxetine (STRATTERA) 40 MG capsule Take 40 mg by mouth daily.     benzonatate  (TESSALON ) 100 MG capsule Take 1 capsule (100 mg total) by mouth every 8 (eight) hours. 21 capsule 0   cetirizine (ZYRTEC) 10 MG tablet TAKE 1 TABLET BY MOUTH TWICE DAILY FOR HIVES/ITCHING     FLUoxetine (PROZAC) 20 MG/5ML solution Take by mouth daily.     hydrOXYzine (ATARAX) 25 MG tablet Take 25 mg by mouth 2 (two) times daily as needed.     metroNIDAZOLE  (  FLAGYL ) 500 MG tablet Take 500 mg by mouth.     mirtazapine (REMERON SOL-TAB) 15 MG disintegrating tablet DISSOLVE 1 TABLET BY MOUTH AT BEDTIME FOR SLEEP     ondansetron  (ZOFRAN -ODT) 4 MG disintegrating tablet 4mg  ODT q4 hours prn nausea/vomit 20 tablet 0   valACYclovir  (VALTREX ) 1000 MG tablet Take 1 tablet (1,000 mg total) by mouth 3 (three) times daily. 21 tablet 0    valACYclovir  (VALTREX ) 500 MG tablet Take 500 mg by mouth 2 (two) times daily.     VENTOLIN HFA 108 (90 Base) MCG/ACT inhaler SMARTSIG:2 Puff(s) By Mouth 4 Times Daily PRN     doxepin  (SINEQUAN ) 10 MG capsule Take 1 capsule (10 mg total) by mouth 3 (three) times daily. (Patient not taking: Reported on 11/26/2023) 21 capsule 0   etonogestrel  (NEXPLANON ) 68 MG IMPL implant by Subdermal route. (Patient not taking: Reported on 11/26/2023)     famotidine (PEPCID) 20 MG tablet TAKE 1 TABLET BY MOUTH TWICE DAILY FOR HIVES/ITCHING. (Patient not taking: Reported on 11/26/2023)     fluticasone (FLONASE) 50 MCG/ACT nasal spray USE 1 TO 2 SPRAY(S) IN EACH NOSTRIL ONCE DAILY FOR ALLERGIES     ibuprofen  (ADVIL ) 800 MG tablet Take 1 tablet (800 mg total) by mouth 3 (three) times daily. (Patient not taking: Reported on 11/26/2023) 30 tablet 0   medroxyPROGESTERone (DEPO-PROVERA) 150 MG/ML injection Inject 150 mg into the muscle every 3 (three) months. (Patient not taking: Reported on 11/26/2023)     polyethylene glycol powder (GLYCOLAX/MIRALAX) 17 GM/SCOOP powder Take 17 g by mouth. (Patient not taking: Reported on 11/26/2023)     traMADol  (ULTRAM ) 50 MG tablet  (Patient not taking: Reported on 11/26/2023)     No current facility-administered medications for this visit.   Known Allergies:  Allergies  Allergen Reactions   Banana    Past Surgical History: Past Surgical History:  Procedure Laterality Date   laproscopic edometriosis surgery     wisdom teeth removal  2010   Family History: Family History  Problem Relation Age of Onset   Asthma Mother    Allergies Mother    Diabetes Father    Allergies Sister    Asthma Sister    Hypertension Maternal Grandmother    Diabetes Maternal Grandfather    Diabetes Paternal Grandfather    Bronchitis Paternal Grandfather    Social History: Natasha Padilla lives apartment.  No water damage.  Carpet throughout.  Electric heating window for cooling.  Cats outside and  inside the home without access to bedroom.  There are roaches in the house and bed is to be off floor.  No dust mite precautions.  No tobacco exposure. Works as Corporate treasurer for past 8 months .   ROS:  All other systems negative except as noted per HPI.  Objective:  Blood pressure 118/72, pulse 91, temperature 98.3 F (36.8 C), temperature source Oral, SpO2 98%. There is no height or weight on file to calculate BMI. Physical Exam:  General Appearance:  Alert, cooperative, no distress, appears stated age  Head:  Normocephalic, without obvious abnormality, atraumatic  Eyes:  Conjunctiva clear, EOM's intact  Ears EACs normal bilaterally and normal TMs bilaterally  Nose: Nares normal, Pink mucosa , hypertrophic turbinates, no visible anterior polyps, and septum midline  Throat: Lips, tongue normal; teeth and gums normal, + cobblestoning  Neck: Supple, symmetrical  Lungs:   clear to auscultation bilaterally, Respirations unlabored, no coughing  Heart:  regular rate and rhythm and no murmur, Appears well perfused  Extremities: No edema  Skin: Skin color, texture, turgor normal and no rashes or lesions on visualized portions of skin  Neurologic: No gross deficits   Diagnostics: Spirometry:  Tracings reviewed. Her effort: Good reproducible efforts. FVC: 3.86L (pre),  FEV1: 3.38L, 115% predicted (pre),  FEV1/FVC ratio: 88 (pre),  Interpretation: Spirometry consistent with normal pattern.  Please see scanned spirometry results for details.   Labs:  Lab Orders  No laboratory test(s) ordered today     Assessment and Plan  Assessment and Plan Assessment & Plan Allergic rhinitis due to pollen with recurrent acute sinusitis Recurrent sinusitis exacerbated by nasal trauma. No regular antihistamine use. - Continue sinus rinses daily. Do 10-15 minutes prior to nose sprays  - Start Flonase nasal spray, one spray per nostril twice daily. 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 - Advise strict avoidance of milk in all forms except baked milk. - Schedule allergy testing including milk. - Will get blood work today for milk  - Will hold off on epipen  prescription until after skin testing   Pineapple oral allergy syndrome Itchy mouth after pineapple consumption. - Advise avoidance of pineapple in all forms. - Will have her return for allergy testing for pineapple  - Will get blood work today for pineapple   Exertional dyspnea and chest pain  Intermittent exertional dyspnea and chest pain, possible cardiac causes considered.  - Breathing test today showed: was normal at baseline and no response to albuterol  - Recommend cardiac evaluation given atypical symptoms and lack of response to albuterol  - Will do trial of ICS/LABA to assess response, but I think it is less likely her symptoms are due to uncontrolled asthma   - Start Symbicort 80mcg 2 puffs twice daily with spacer.    -rinse mouth after use    Follow up: Monday at 9:30 for allergy testing (1-55, milk, pineapple)    This note in its entirety was forwarded to the Provider who requested this consultation.  Other: reviewed spirometry technique and reviewed inhaler technique  Thank you for your kind referral. I appreciate the opportunity to take part in Jinna's care. Please do not hesitate to contact me with questions.  Sincerely,  Thank you so much for letting me partake in your care today.  Don't hesitate to reach out if you have any additional concerns!  Hargis Springer, MD  [facility] [place], [place]

## 2023-11-29 ENCOUNTER — Ambulatory Visit: Admitting: Internal Medicine

## 2023-11-29 LAB — ALLERGEN, PINEAPPLE, F210: Pineapple IgE: <0.1 kU/L

## 2023-11-29 LAB — MILK COMPONENT PANEL
F076-IgE Alpha Lactalbumin: 0.41 kU/L — AB
F077-IgE Beta Lactoglobulin: 2.91 kU/L — AB
F078-IgE Casein: <0.1 kU/L

## 2023-12-01 NOTE — Telephone Encounter (Signed)
 May need to re-send for Brand Symbicort so patient hopefully will not keep having issues with getting medication.

## 2023-12-02 MED ORDER — BUDESONIDE-FORMOTEROL FUMARATE 80-4.5 MCG/ACT IN AERO: 2.0000 | INHALATION_SPRAY | Freq: Two times a day (BID) | RESPIRATORY_TRACT | 5 refills | Status: AC

## 2023-12-02 NOTE — Addendum Note (Signed)
 Addended by: ONEITA CHRISTIANS D on: 12/02/2023 10:01 AM   Modules accepted: Orders

## 2023-12-02 NOTE — Telephone Encounter (Signed)
Re-sent as DAW

## 2023-12-10 ENCOUNTER — Ambulatory Visit: Payer: Self-pay | Admitting: Internal Medicine

## 2023-12-10 NOTE — Progress Notes (Signed)
 Blood work mildly postive to milk, negative to pineapple.   Next step is skin testing.  Can someone let patient know?

## 2023-12-23 NOTE — Progress Notes (Signed)
 Pt was scheduled  10/20 environmental and she canceled/noshowed

## 2023-12-24 NOTE — Telephone Encounter (Signed)
 After several attempts and no show mailed information out to patient to reschedule appointment for allergy test.
